# Patient Record
Sex: Female | Born: 1970 | Race: Black or African American | Hispanic: No | Marital: Single | Smoking: Former smoker
Health system: Southern US, Community
[De-identification: ages and names within clinical notes are randomized; demographics above are authoritative.]

## PROBLEM LIST (undated history)

## (undated) DIAGNOSIS — R011 Cardiac murmur, unspecified: Secondary | ICD-10-CM

## (undated) HISTORY — DX: Cardiac murmur, unspecified: R01.1

---

## 1998-12-02 ENCOUNTER — Emergency Department (HOSPITAL_COMMUNITY): Admission: EM | Admit: 1998-12-02 | Discharge: 1998-12-02 | Payer: Self-pay | Admitting: Emergency Medicine

## 1999-08-16 ENCOUNTER — Emergency Department (HOSPITAL_COMMUNITY): Admission: EM | Admit: 1999-08-16 | Discharge: 1999-08-16 | Payer: Self-pay

## 1999-08-28 ENCOUNTER — Emergency Department (HOSPITAL_COMMUNITY): Admission: EM | Admit: 1999-08-28 | Discharge: 1999-08-28 | Payer: Self-pay

## 1999-08-31 ENCOUNTER — Emergency Department (HOSPITAL_COMMUNITY): Admission: EM | Admit: 1999-08-31 | Discharge: 1999-08-31 | Payer: Self-pay | Admitting: Emergency Medicine

## 2000-06-07 ENCOUNTER — Emergency Department (HOSPITAL_COMMUNITY): Admission: EM | Admit: 2000-06-07 | Discharge: 2000-06-07 | Payer: Self-pay | Admitting: *Deleted

## 2000-06-11 ENCOUNTER — Inpatient Hospital Stay (HOSPITAL_COMMUNITY): Admission: AD | Admit: 2000-06-11 | Discharge: 2000-06-11 | Payer: Self-pay | Admitting: Obstetrics and Gynecology

## 2000-06-23 ENCOUNTER — Other Ambulatory Visit: Admission: RE | Admit: 2000-06-23 | Discharge: 2000-06-23 | Payer: Self-pay | Admitting: Obstetrics and Gynecology

## 2000-12-06 ENCOUNTER — Inpatient Hospital Stay (HOSPITAL_COMMUNITY): Admission: AD | Admit: 2000-12-06 | Discharge: 2000-12-06 | Payer: Self-pay | Admitting: Obstetrics and Gynecology

## 2001-01-26 ENCOUNTER — Inpatient Hospital Stay (HOSPITAL_COMMUNITY): Admission: AD | Admit: 2001-01-26 | Discharge: 2001-01-26 | Payer: Self-pay | Admitting: Obstetrics and Gynecology

## 2001-02-01 ENCOUNTER — Inpatient Hospital Stay (HOSPITAL_COMMUNITY): Admission: AD | Admit: 2001-02-01 | Discharge: 2001-02-01 | Payer: Self-pay | Admitting: Obstetrics and Gynecology

## 2001-02-01 ENCOUNTER — Inpatient Hospital Stay (HOSPITAL_COMMUNITY): Admission: AD | Admit: 2001-02-01 | Discharge: 2001-02-03 | Payer: Self-pay | Admitting: Obstetrics and Gynecology

## 2002-02-19 ENCOUNTER — Encounter: Payer: Self-pay | Admitting: Emergency Medicine

## 2002-02-19 ENCOUNTER — Emergency Department (HOSPITAL_COMMUNITY): Admission: EM | Admit: 2002-02-19 | Discharge: 2002-02-19 | Payer: Self-pay | Admitting: Emergency Medicine

## 2005-10-17 ENCOUNTER — Emergency Department (HOSPITAL_COMMUNITY): Admission: EM | Admit: 2005-10-17 | Discharge: 2005-10-17 | Payer: Self-pay | Admitting: Emergency Medicine

## 2009-03-23 ENCOUNTER — Emergency Department (HOSPITAL_COMMUNITY): Admission: EM | Admit: 2009-03-23 | Discharge: 2009-03-23 | Payer: Self-pay | Admitting: Emergency Medicine

## 2009-11-21 ENCOUNTER — Emergency Department (HOSPITAL_COMMUNITY): Admission: EM | Admit: 2009-11-21 | Discharge: 2009-11-21 | Payer: Self-pay | Admitting: Emergency Medicine

## 2010-08-17 NOTE — H&P (Signed)
[facility]  Patient:    KEYMANI, GLYNN Visit Number: [id number] MRN: [id number]          Service Type: OBS Location: [id number] Attending Physician:  Leonard Schwartz Dictated by:   Wynelle Bourgeois, CNM Admit Date:  02/01/2001                           History and Physical  HISTORY OF PRESENT ILLNESS:   This is a 40 year old, G2, P1-0-0-1, at 40-2/7 weeks, who presents with regular uterine contractions every five to seven minutes for several hours.  She denies leaking.  She reports positive fetal movement.  Her pregnancy has been followed by the nurse midwife service and remarkable for:  1. First trimester bacterial vaginosis.  2. Group B streptococcus negative.  She is requesting an epidural for pain management.  OBSTETRICAL HISTORY:          Remarkable for a spontaneous vaginal delivery in 1996 of a female infant at [redacted] weeks gestation, weighing 7 pounds 14 ounces, following a three-day induction with no complications.  PAST MEDICAL HISTORY:         Remarkable for history of hyperemesis with her first pregnancy and history of being told she has a heart murmur, but has never been treated.  PAST SURGICAL HISTORY:        Remarkable for wisdom teeth extraction three years ago.  FAMILY HISTORY:               Remarkable for maternal uncle with renal failure.  GENETIC HISTORY:              Unremarkable.  SOCIAL HISTORY:               The patient is single.  The father of the baby, Piedad Climes, is not involved.  She does have two female family members with her at present.  She does not report a religious affiliation.  She denies alcohol, tobacco, or drug use.  PHYSICAL EXAMINATION:  VITAL SIGNS:                  Stable.  She is afebrile.  HEENT:                        Within normal limits.  Thyroid normal and not enlarged.  CHEST:                        Clear to auscultation bilaterally.  HEART:                        Regular  rate and rhythm.  ABDOMEN:                      Gravid at 40 cm.  Vertex to [address].  EFM denotes reactive fetal heart rate, 130-140 with accelerations to 150-155. There are variable decelerations to 90, which have quick return to baseline and are intermittent.  Uterine contractions every three to nine minutes.  PELVIC:                       Cervix 5 cm, 100% effaced, and 0 station with a vertex presentation and bulging bag of waters.  EXTREMITIES:                  Within normal limits.  ASSESSMENT:                   1. Intrauterine pregnancy at 40-2/7 weeks.                               2. Active labor with irregular uterine                                  contractions.                               3. Group B streptococcus negative.  PLAN:                         1. Admit to birthing suite.  Janine Limbo,                                  M.D., notified of admission.                               2. Routine CNM orders.                               3. Plan epidural followed by amniotomy to                                  stimulate labor. Dictated by:   Wynelle Bourgeois, CNM Attending Physician:  Leonard Schwartz DD:  02/01/01 TD:  02/01/01 Job: [id number]

## 2012-09-28 ENCOUNTER — Emergency Department (HOSPITAL_COMMUNITY): Payer: Self-pay

## 2012-09-28 ENCOUNTER — Encounter (HOSPITAL_COMMUNITY): Payer: Self-pay | Admitting: Emergency Medicine

## 2012-09-28 ENCOUNTER — Emergency Department (HOSPITAL_COMMUNITY)
Admission: EM | Admit: 2012-09-28 | Discharge: 2012-09-28 | Disposition: A | Discharge status: Home / Self Care | Payer: Self-pay | Attending: Emergency Medicine | Admitting: Emergency Medicine

## 2012-09-28 DIAGNOSIS — R05 Cough: Secondary | ICD-10-CM | POA: Insufficient documentation

## 2012-09-28 DIAGNOSIS — R5383 Other fatigue: Secondary | ICD-10-CM | POA: Insufficient documentation

## 2012-09-28 DIAGNOSIS — J020 Streptococcal pharyngitis: Secondary | ICD-10-CM | POA: Insufficient documentation

## 2012-09-28 DIAGNOSIS — R5381 Other malaise: Secondary | ICD-10-CM | POA: Insufficient documentation

## 2012-09-28 DIAGNOSIS — R059 Cough, unspecified: Secondary | ICD-10-CM | POA: Insufficient documentation

## 2012-09-28 DIAGNOSIS — E876 Hypokalemia: Secondary | ICD-10-CM | POA: Insufficient documentation

## 2012-09-28 DIAGNOSIS — R509 Fever, unspecified: Secondary | ICD-10-CM | POA: Insufficient documentation

## 2012-09-28 DIAGNOSIS — IMO0001 Reserved for inherently not codable concepts without codable children: Secondary | ICD-10-CM | POA: Insufficient documentation

## 2012-09-28 DIAGNOSIS — F17200 Nicotine dependence, unspecified, uncomplicated: Secondary | ICD-10-CM | POA: Insufficient documentation

## 2012-09-28 DIAGNOSIS — R079 Chest pain, unspecified: Secondary | ICD-10-CM | POA: Insufficient documentation

## 2012-09-28 DIAGNOSIS — R51 Headache: Secondary | ICD-10-CM | POA: Insufficient documentation

## 2012-09-28 LAB — CBC
HCT: 37.8 % (ref 36.0–46.0)
Hemoglobin: 12.4 g/dL (ref 12.0–15.0)
MCH: 24.4 pg — ABNORMAL LOW (ref 26.0–34.0)
MCHC: 32.8 g/dL (ref 30.0–36.0)
MCV: 74.4 fL — ABNORMAL LOW (ref 78.0–100.0)
Platelets: 220 10*3/uL (ref 150–400)
RBC: 5.08 MIL/uL (ref 3.87–5.11)
RDW: 16.2 % — ABNORMAL HIGH (ref 11.5–15.5)
WBC: 10 10*3/uL (ref 4.0–10.5)

## 2012-09-28 LAB — BASIC METABOLIC PANEL
BUN: 6 mg/dL (ref 6–23)
CO2: 23 mEq/L (ref 19–32)
Calcium: 9.4 mg/dL (ref 8.4–10.5)
Chloride: 98 mEq/L (ref 96–112)
Creatinine, Ser: 0.64 mg/dL (ref 0.50–1.10)
GFR calc Af Amer: >90 mL/min (ref >90)
GFR calc non Af Amer: >90 mL/min (ref >90)
Glucose, Bld: 102 mg/dL — ABNORMAL HIGH (ref 70–99)
Potassium: 3.1 mEq/L — ABNORMAL LOW (ref 3.5–5.1)
Sodium: 135 mEq/L (ref 135–145)

## 2012-09-28 LAB — RAPID STREP SCREEN (MED CTR MEBANE ONLY): Streptococcus, Group A Screen (Direct): POSITIVE — AB

## 2012-09-28 MED ORDER — PENICILLIN V POTASSIUM 500 MG PO TABS: 500.0000 mg | ORAL_TABLET | Freq: Four times a day (QID) | ORAL | Status: DC

## 2012-09-28 MED ORDER — ACETAMINOPHEN 500 MG PO TABS
1000.0000 mg | ORAL_TABLET | Freq: Once | ORAL | Status: AC
  Administered 2012-09-28: 1000 mg via ORAL
  Filled 2012-09-28: qty 2

## 2012-09-28 NOTE — ED Provider Notes (Signed)
History    CSN: [id number] Arrival date & time 09/28/12  1003  First MD Initiated Contact with Patient 09/28/12 1019     Chief Complaint  Patient presents with  . Generalized Body Aches  . Sore Throat  . Chest Pain   (Consider location/radiation/quality/duration/timing/severity/associated sxs/prior Treatment) HPI Pt is a 42yo female with 2 day hx of generalized body aches associated with severe sore throat, worse with swallowing, 8/10, no trouble breathing.  Pt also c/o gradual onset of headache, feels like a typical headache but worse with coughing, 7/10 associated with aching chest pain worse with coughing. Subjective intermittent fever. Has tried aspirin w/o relief.  Denies change in vision, SOB, abdominal pain, n/v/d. Denies urinary symptoms.  Denies recent travel or sick contacts.  LMP: earlier this month, nl per pt. Denies known cardiopulmonary disease.   History reviewed. No pertinent past medical history. History reviewed. No pertinent past surgical history. No family history on file. History  Substance Use Topics  . Smoking status: Current Some Day Smoker    Types: Cigarettes  . Smokeless tobacco: Not on file  . Alcohol Use: Yes     Comment: Ocassional   OB History   Grav Para Term Preterm Abortions TAB SAB Ect Mult Living                 Review of Systems  Constitutional: Positive for fever, chills, appetite change ( decreased) and fatigue. Negative for diaphoresis and unexpected weight change.  HENT: Positive for sore throat. Negative for congestion, rhinorrhea, trouble swallowing, neck pain, neck stiffness and voice change.   Respiratory: Positive for cough. Negative for chest tightness and shortness of breath.   Cardiovascular: Positive for chest pain. Negative for palpitations and leg swelling.  Gastrointestinal: Negative for nausea, vomiting, abdominal pain, diarrhea and constipation.  Genitourinary: Negative for dysuria, urgency, frequency, flank pain, vaginal  bleeding, vaginal discharge, vaginal pain, menstrual problem and pelvic pain.  Musculoskeletal: Positive for myalgias.  Neurological: Positive for headaches.  All other systems reviewed and are negative.    Allergies  Review of patient's allergies indicates no known allergies.  Home Medications   Current Outpatient Rx  Name  Route  Sig  Dispense  Refill  . aspirin 325 MG tablet   Oral   Take 650 mg by mouth every 4 (four) hours as needed for pain.         Marland Kitchen penicillin v potassium (VEETID) 500 MG tablet   Oral   Take 1 tablet (500 mg total) by mouth 4 (four) times daily.   40 tablet   0    BP 130/76  Pulse 82  Temp(Src) 99.3 F (37.4 C) (Oral)  Resp 16  SpO2 100%  LMP 09/07/2012 Physical Exam  Nursing note and vitals reviewed. Constitutional: She appears well-developed and well-nourished.  Pt lying in exam bed, NAD. Appears uncomfortable.   HENT:  Head: Normocephalic and atraumatic.  Right Ear: Hearing, tympanic membrane, external ear and ear canal normal.  Left Ear: Hearing, tympanic membrane, external ear and ear canal normal.  Nose: Nose normal.  Mouth/Throat: Uvula is midline and mucous membranes are normal. No dental abscesses. Oropharyngeal exudate, posterior oropharyngeal edema and posterior oropharyngeal erythema present. No tonsillar abscesses.  Eyes: Conjunctivae are normal. No scleral icterus.  Neck: Normal range of motion. Neck supple.  No nuchal rigidity or meningeal signs.    Cardiovascular: Normal rate, regular rhythm and normal heart sounds.   Pulmonary/Chest: Effort normal and breath sounds normal. No respiratory  distress. She has no wheezes. She has no rales. She exhibits no tenderness.  Abdominal: Soft. Bowel sounds are normal. She exhibits no distension and no mass. There is no tenderness. There is no rebound and no guarding.  Musculoskeletal: Normal range of motion.  Neurological: She is alert.  Skin: Skin is warm and dry. She is not  diaphoretic.    ED Course  Procedures (including critical care time) Labs Reviewed  RAPID STREP SCREEN - Abnormal; Notable for the following:    Streptococcus, Group A Screen (Direct) POSITIVE (*)    All other components within normal limits  CBC - Abnormal; Notable for the following:    MCV 74.4 (*)    MCH 24.4 (*)    RDW 16.2 (*)    All other components within normal limits  BASIC METABOLIC PANEL - Abnormal; Notable for the following:    Potassium 3.1 (*)    Glucose, Bld 102 (*)    All other components within normal limits   Dg Chest 2 View  09/28/2012   *RADIOLOGY REPORT*  Clinical Data: 42 year old female with chest pain times 3 days on the left side.  Shortness of breath.  CHEST - 2 VIEW  Comparison: None.  Findings: Normal lung volumes.  Mild eventration of the right hemidiaphragm. Normal cardiac size and mediastinal contours. Visualized tracheal air column is within normal limits.  No pneumothorax or pleural effusion.  The lungs are clear. No acute osseous abnormality identified.  IMPRESSION: Negative, no acute cardiopulmonary abnormality.   Original Report Authenticated By: Erskine Speed, M.D.   1. Strep pharyngitis   2. Hypokalemia     MDM  Pt presented with 2 day hx of flu-like symptoms including severe sore throat, headache, chest pain, and body aches.  Will get basic labs, rapid strep, and CXR.  Strep: Pos. CXR: nl CBC: unremarkable  BMP: mild hypokalemia   Rx: PCN. May take OTC tylenol and ibuprofen as needed for pain and fever. Work note provided. Will discharge pt home and have her f/u with [facility] and [facility] info provided. Return precautions given. Pt verbalized understanding and agreement with tx plan. Vitals: unremarkable. Discharged in stable condition.    Discussed pt with attending during ED encounter.   Junius Finner, PA-C 09/28/12 1359

## 2012-09-28 NOTE — ED Notes (Signed)
Pt arrived by POV with c/o body aches along with CP and throat pain. Stated that it started Saturday. Denies any n/v, SOB and dizziness. Stated that she did have a little diarrhea last night and this morning.

## 2012-09-28 NOTE — ED Notes (Signed)
Patient transported to X-ray 

## 2012-09-29 NOTE — ED Provider Notes (Signed)
Medical screening examination/treatment/procedure(s) were performed by non-physician practitioner and as supervising physician I was immediately available for consultation/collaboration.  Mira Balon, MD 09/29/12 1535 

## 2013-07-09 ENCOUNTER — Emergency Department (HOSPITAL_COMMUNITY): Payer: Self-pay

## 2013-07-09 ENCOUNTER — Encounter (HOSPITAL_COMMUNITY): Payer: Self-pay | Admitting: Emergency Medicine

## 2013-07-09 ENCOUNTER — Emergency Department (HOSPITAL_COMMUNITY)
Admission: EM | Admit: 2013-07-09 | Discharge: 2013-07-09 | Disposition: A | Discharge status: Home / Self Care | Payer: Self-pay | Attending: Emergency Medicine | Admitting: Emergency Medicine

## 2013-07-09 DIAGNOSIS — S27819A Unspecified injury of esophagus (thoracic part), initial encounter: Secondary | ICD-10-CM | POA: Insufficient documentation

## 2013-07-09 DIAGNOSIS — Y929 Unspecified place or not applicable: Secondary | ICD-10-CM | POA: Insufficient documentation

## 2013-07-09 DIAGNOSIS — Y9389 Activity, other specified: Secondary | ICD-10-CM | POA: Insufficient documentation

## 2013-07-09 DIAGNOSIS — S27818A Other injury of esophagus (thoracic part), initial encounter: Secondary | ICD-10-CM

## 2013-07-09 DIAGNOSIS — IMO0002 Reserved for concepts with insufficient information to code with codable children: Secondary | ICD-10-CM | POA: Insufficient documentation

## 2013-07-09 DIAGNOSIS — Z792 Long term (current) use of antibiotics: Secondary | ICD-10-CM | POA: Insufficient documentation

## 2013-07-09 DIAGNOSIS — Z7982 Long term (current) use of aspirin: Secondary | ICD-10-CM | POA: Insufficient documentation

## 2013-07-09 DIAGNOSIS — F17200 Nicotine dependence, unspecified, uncomplicated: Secondary | ICD-10-CM | POA: Insufficient documentation

## 2013-07-09 MED ORDER — GI COCKTAIL ~~LOC~~
30.0000 mL | Freq: Once | ORAL | Status: AC
  Administered 2013-07-09: 30 mL via ORAL
  Filled 2013-07-09: qty 30

## 2013-07-09 MED ORDER — TRAMADOL HCL 50 MG PO TABS: 50.0000 mg | ORAL_TABLET | Freq: Four times a day (QID) | ORAL | Status: DC | PRN

## 2013-07-09 NOTE — Discharge Instructions (Signed)
Follow up with the ENT physician if symptoms persist.  Tramadol tablets What is this medicine? TRAMADOL (TRA ma dole) is a pain reliever. It is used to treat moderate to severe pain in adults. This medicine may be used for other purposes; ask your health care provider or pharmacist if you have questions. COMMON BRAND NAME(S): Ultram What should I tell my health care provider before I take this medicine? They need to know if you have any of these conditions: -brain tumor -depression -drug abuse or addiction -head injury -if you frequently drink alcohol containing drinks -kidney disease or trouble passing urine -liver disease -lung disease, asthma, or breathing problems -seizures or epilepsy -suicidal thoughts, plans, or attempt; a previous suicide attempt by you or a family member -an unusual or allergic reaction to tramadol, codeine, other medicines, foods, dyes, or preservatives -pregnant or trying to get pregnant -breast-feeding How should I use this medicine? Take this medicine by mouth with a full glass of water. Follow the directions on the prescription label. If the medicine upsets your stomach, take it with food or milk. Do not take more medicine than you are told to take. Talk to your pediatrician regarding the use of this medicine in children. Special care may be needed. Overdosage: If you think you have taken too much of this medicine contact a poison control center or emergency room at once. NOTE: This medicine is only for you. Do not share this medicine with others. What if I miss a dose? If you miss a dose, take it as soon as you can. If it is almost time for your next dose, take only that dose. Do not take double or extra doses. What may interact with this medicine? Do not take this medicine with any of the following medications: -MAOIs like Carbex, Eldepryl, Marplan, Nardil, and Parnate This medicine may also interact with the following medications: -alcohol or  medicines that contain alcohol -antihistamines -benzodiazepines -bupropion -carbamazepine or oxcarbazepine -clozapine -cyclobenzaprine -digoxin -furazolidone -linezolid -medicines for depression, anxiety, or psychotic disturbances -medicines for migraine headache like almotriptan, eletriptan, frovatriptan, naratriptan, rizatriptan, sumatriptan, zolmitriptan -medicines for pain like pentazocine, buprenorphine, butorphanol, meperidine, nalbuphine, and propoxyphene -medicines for sleep -muscle relaxants -naltrexone -phenobarbital -phenothiazines like perphenazine, thioridazine, chlorpromazine, mesoridazine, fluphenazine, prochlorperazine, promazine, and trifluoperazine -procarbazine -warfarin This list may not describe all possible interactions. Give your health care provider a list of all the medicines, herbs, non-prescription drugs, or dietary supplements you use. Also tell them if you smoke, drink alcohol, or use illegal drugs. Some items may interact with your medicine. What should I watch for while using this medicine? Tell your doctor or health care professional if your pain does not go away, if it gets worse, or if you have new or a different type of pain. You may develop tolerance to the medicine. Tolerance means that you will need a higher dose of the medicine for pain relief. Tolerance is normal and is expected if you take this medicine for a long time. Do not suddenly stop taking your medicine because you may develop a severe reaction. Your body becomes used to the medicine. This does NOT mean you are addicted. Addiction is a behavior related to getting and using a drug for a non-medical reason. If you have pain, you have a medical reason to take pain medicine. Your doctor will tell you how much medicine to take. If your doctor wants you to stop the medicine, the dose will be slowly lowered over time to avoid any side effects.  You may get drowsy or dizzy. Do not drive, use machinery,  or do anything that needs mental alertness until you know how this medicine affects you. Do not stand or sit up quickly, especially if you are an older patient. This reduces the risk of dizzy or fainting spells. Alcohol can increase or decrease the effects of this medicine. Avoid alcoholic drinks. You may have constipation. Try to have a bowel movement at least every 2 to 3 days. If you do not have a bowel movement for 3 days, call your doctor or health care professional. Your mouth may get dry. Chewing sugarless gum or sucking hard candy, and drinking plenty of water may help. Contact your doctor if the problem does not go away or is severe. What side effects may I notice from receiving this medicine? Side effects that you should report to your doctor or health care professional as soon as possible: -allergic reactions like skin rash, itching or hives, swelling of the face, lips, or tongue -breathing difficulties, wheezing -confusion -itching -light headedness or fainting spells -redness, blistering, peeling or loosening of the skin, including inside the mouth -seizures Side effects that usually do not require medical attention (report to your doctor or health care professional if they continue or are bothersome): -constipation -dizziness -drowsiness -headache -nausea, vomiting This list may not describe all possible side effects. Call your doctor for medical advice about side effects. You may report side effects to FDA at 1-800-FDA-1088. Where should I keep my medicine? Keep out of the reach of children. Store at room temperature between 15 and 30 degrees C (59 and 86 degrees F). Keep container tightly closed. Throw away any unused medicine after the expiration date. NOTE: This sheet is a summary. It may not cover all possible information. If you have questions about this medicine, talk to your doctor, pharmacist, or health care provider.  2014, [employer]vier/Gold Standard. (2009-11-29  11:55:44)

## 2013-07-09 NOTE — ED Notes (Signed)
Patient states that she felt like she could feel the shell in the back of her throat. RN assessed patient airway and could not visualize shell.

## 2013-07-09 NOTE — ED Notes (Signed)
Pt states she had shrimp for dinner and thinks the shell from one is stuck in her throat.  Pt states she has started coughing blood from coughing so much

## 2013-07-09 NOTE — ED Provider Notes (Signed)
CSN: [id number]     Arrival date & time 07/09/13  4098 History   First MD Initiated Contact with Patient 07/09/13 [phone]     Chief Complaint  Patient presents with  . Hemoptysis     (Consider location/radiation/quality/duration/timing/severity/associated sxs/prior Treatment) The history is provided by the patient.   43 year old female was eating shrimp for dinner and one of them apparently had not been adequately peeled and she feels like the cell is stuck in her throat. It feels like it is on the left side. She's been trying to cough up but has been unable to do so and she is now coughing up some blood. She denies dyspnea, nausea, vomiting.  History reviewed. No pertinent past medical history. History reviewed. No pertinent past surgical history. No family history on file. History  Substance Use Topics  . Smoking status: Current Some Day Smoker    Types: Cigarettes  . Smokeless tobacco: Not on file  . Alcohol Use: Yes     Comment: Ocassional   OB History   Grav Para Term Preterm Abortions TAB SAB Ect Mult Living                 Review of Systems  All other systems reviewed and are negative.     Allergies  Review of patient's allergies indicates no known allergies.  Home Medications   Current Outpatient Rx  Name  Route  Sig  Dispense  Refill  . aspirin 325 MG tablet   Oral   Take 650 mg by mouth every 4 (four) hours as needed for pain.         Marland Kitchen penicillin v potassium (VEETID) 500 MG tablet   Oral   Take 1 tablet (500 mg total) by mouth 4 (four) times daily.   40 tablet   0    BP 144/94  Pulse 111  Temp(Src) 99.2 F (37.3 C) (Oral)  Resp 18  Wt 140 lb (63.504 kg)  SpO2 99% Physical Exam  Nursing note and vitals reviewed.  43 year old female, resting comfortably and in no acute distress. Vital signs are significant for tachycardia with heart rate 111, and hypertension with blood pressure 144/94. Oxygen saturation is 99%, which is normal. Head is  normocephalic and atraumatic. PERRLA, EOMI. Oropharynx is mildly erythematous. No foreign body is seen. There is no stridor. She has no difficulty with her secretions. Phonation is normal. Neck is nontender and supple without adenopathy or JVD. Back is nontender and there is no CVA tenderness. Lungs are clear without rales, wheezes, or rhonchi. Chest is nontender. Heart has regular rate and rhythm without murmur. Abdomen is soft, flat, nontender without masses or hepatosplenomegaly and peristalsis is normoactive. Extremities have no cyanosis or edema, full range of motion is present. Skin is warm and dry without rash. Neurologic: Mental status is normal, cranial nerves are intact, there are no motor or sensory deficits.  ED Course  Procedures (including critical care time) Imaging Review Ct Soft Tissue Neck Wo Contrast  07/09/2013   CLINICAL DATA:  Hemoptysis.  Question foreign body.  EXAM: CT NECK WITHOUT CONTRAST  TECHNIQUE: Multidetector CT imaging of the neck was performed following the standard protocol without intravenous contrast.  COMPARISON:  None.  FINDINGS: As permitted by noncontrast technique, no evidence of mass, edema, or foreign body along the surfaces of the aerodigestive tract. There is motion at the level of the glottis, limiting assessment at this level. The salivary and thyroid glands are unremarkable. No suspicious nodal  enlargement. No acute osseous finding. There is diffuse degenerative disc narrowing with spondylotic spurring and sub- ligamentous disc at C3-4, C4-5, and C5-6. No evidence of high-grade canal stenosis. Incidental torus palatini. Clear apical lungs.  IMPRESSION: Negative noncontrast neck CT.   Electronically Signed   By: Tiburcio PeaJonathan  Watts M.D.   On: 07/09/2013 05:22   Images viewed by me.  MDM   Final diagnoses:  Esophageal abrasion    Possible upper esophageal foreign body versus mucosal abrasion. She'll be given a trial of a GI cocktail and will be sent  for soft tissue CT scan to look for foreign body.  CT shows no evidence of foreign body. She got significant but not complete relief of pain with a GI cocktail. She is referred to ENT for followup. She continues to tolerate secretions well and continues to have normal phonation.  Dione Boozeavid Pranav Lince, MD 07/09/13 [phone]

## 2015-08-17 IMAGING — CT CT NECK W/O CM
4 of 5 series · 16 of 33 positions shown, 18 images · non-contrast
Comparison: None.

CLINICAL DATA: Hemoptysis.  Question foreign body.

EXAM:
CT NECK WITHOUT CONTRAST
TECHNIQUE: Multidetector CT imaging of the neck was performed following the
standard protocol without intravenous contrast.

[Series 5: soft tissue · axial · 0.49mm/px · z∈[+165,+299]mm · 4 of 113 slices shown]
[im 23/113  soft-tissue]
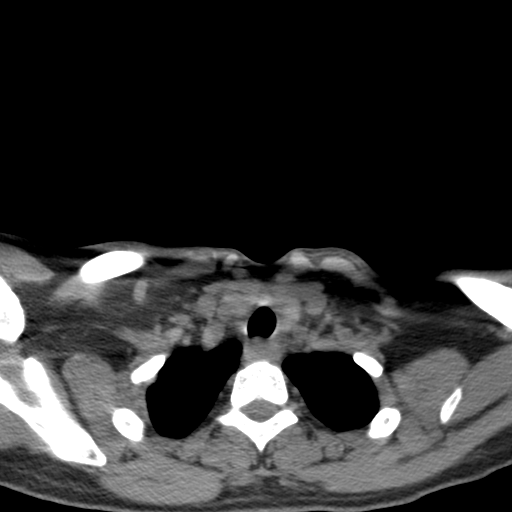
[im 45/113  soft-tissue]
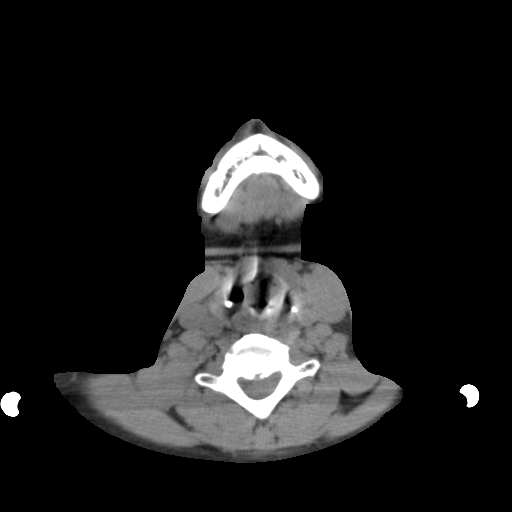
[im 68/113  soft-tissue]
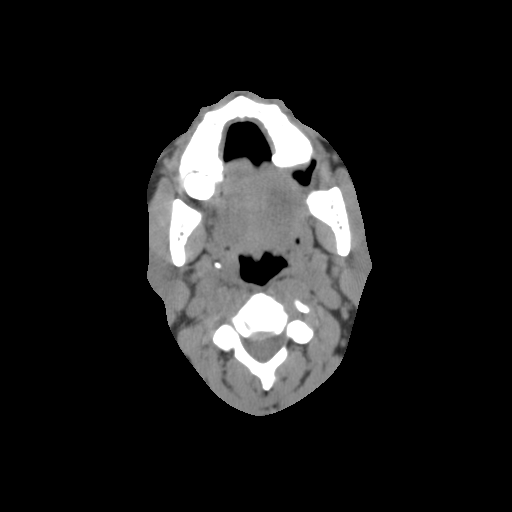
[im 90/113  soft-tissue]
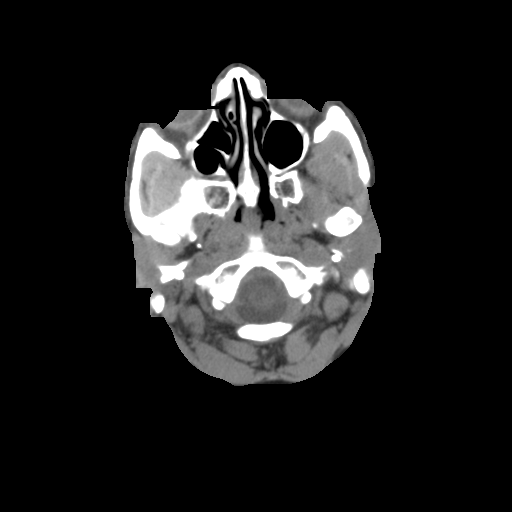

[mpr, sagittal, sagittal · sagittal · 0.49mm/px · 5 of 69 slices shown, 6 images]
[im 23/69  bone]
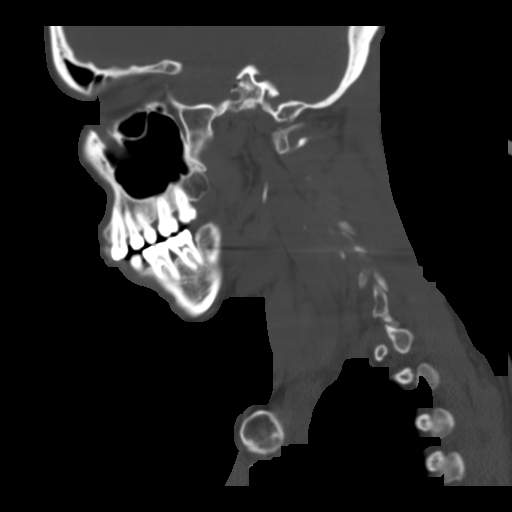
[im 29/69  bone]
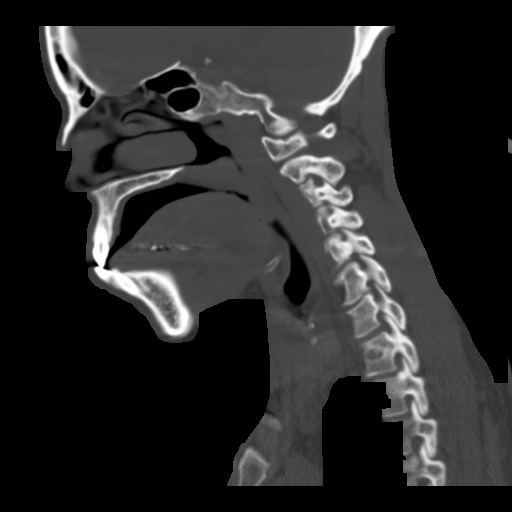
[im 35/69  soft-tissue]
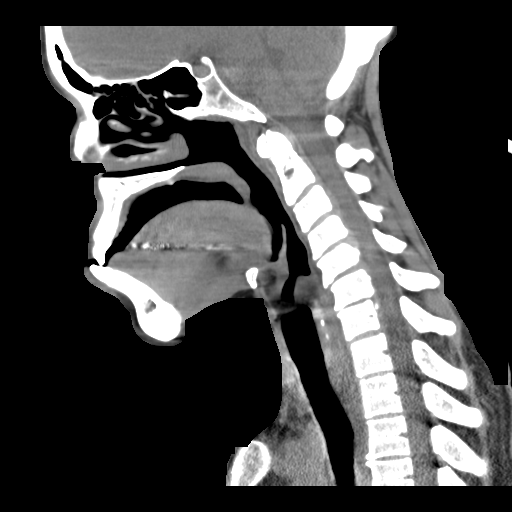
[im 35/69  bone]
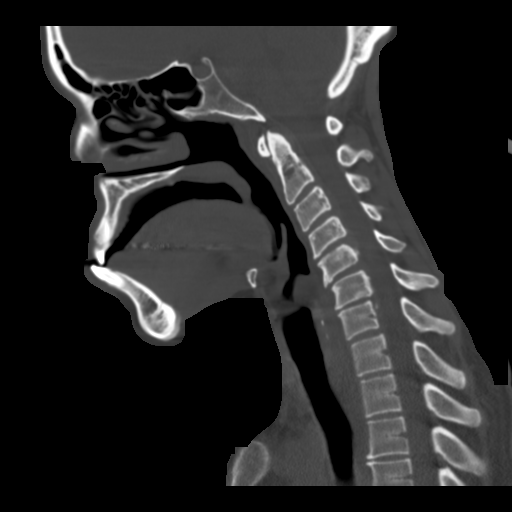
[im 40/69  bone]
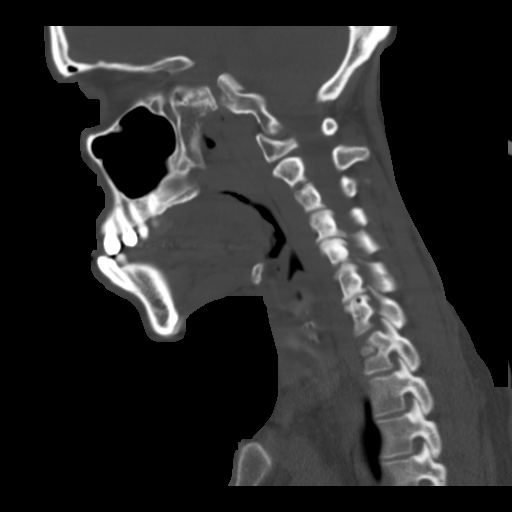
[im 46/69  bone]
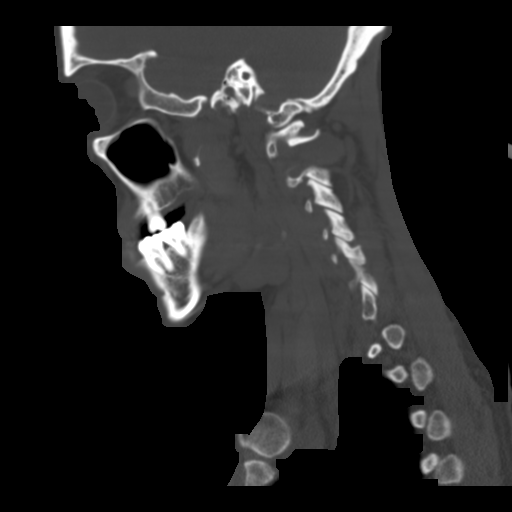

[mpr, coronal, coronal · coronal · 0.49mm/px · 3 of 87 slices shown]
[im 18/87  bone]
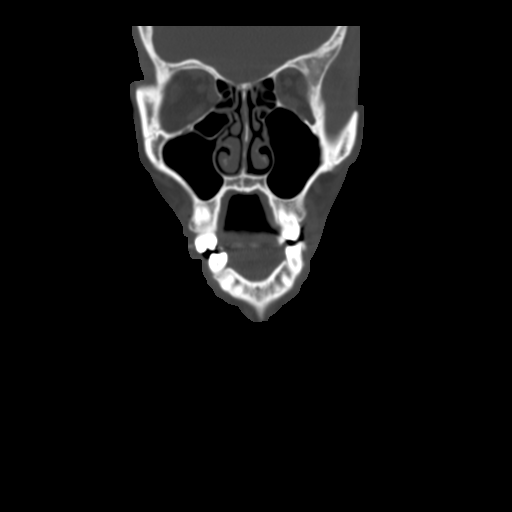
[im 35/87  bone]
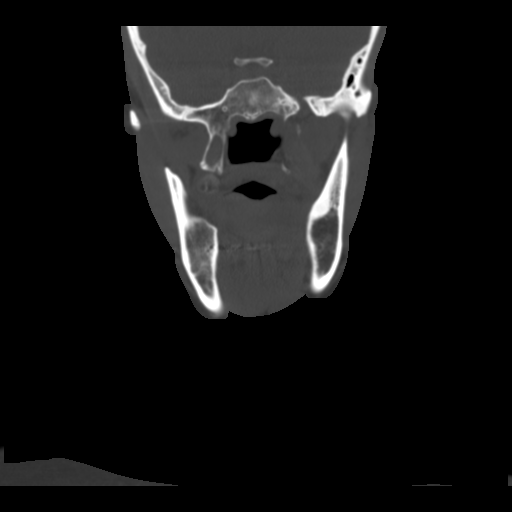
[im 52/87  bone]
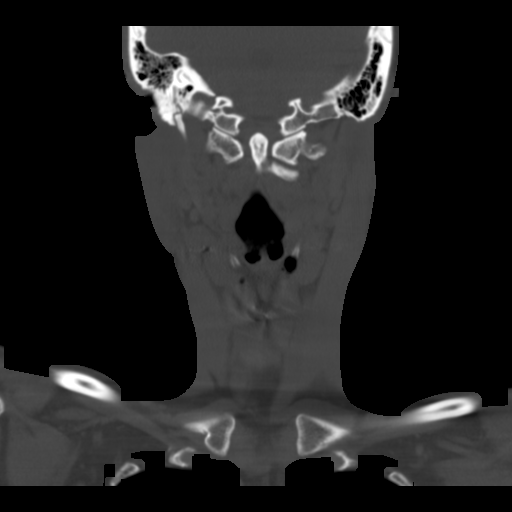

[mpr, orthogonal · axial · 0.49mm/px · z∈[+130,+239]mm · 4 of 103 slices shown, 5 images]
[im 21/103  soft-tissue]
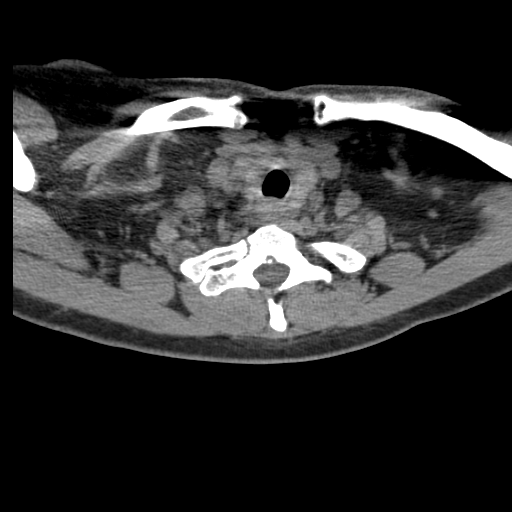
[im 21/103  bone]
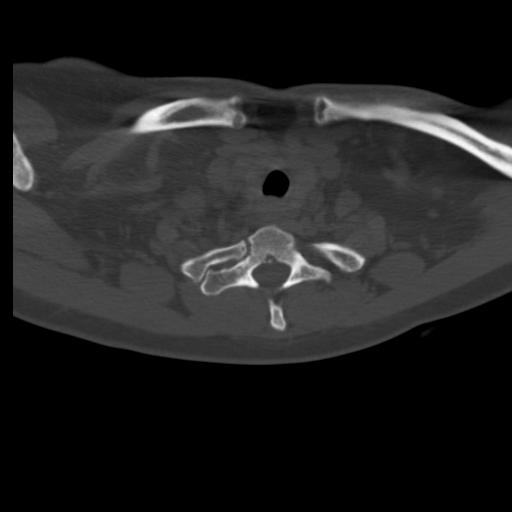
[im 41/103  bone]
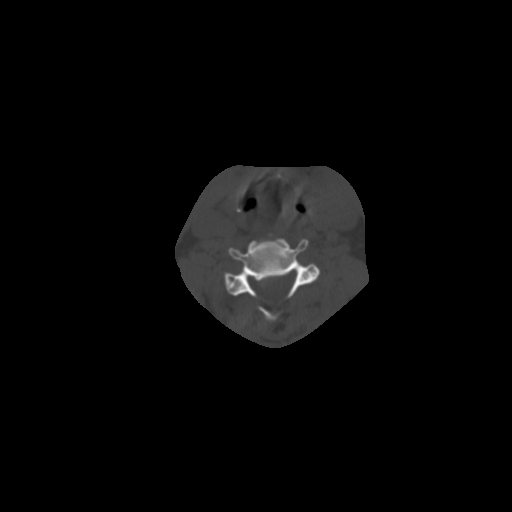
[im 62/103  bone]
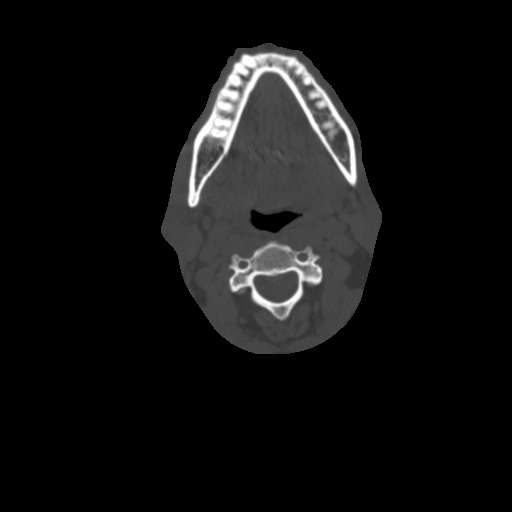
[im 82/103  bone]
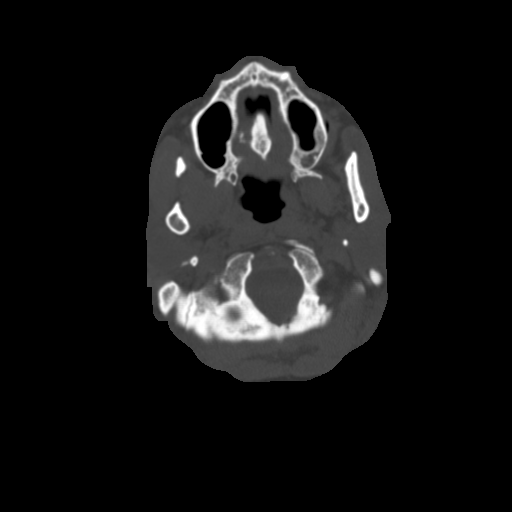

[16 of 33 positions shown; findings below may reference images not displayed]

FINDINGS: As permitted by noncontrast technique, no evidence of mass, edema,
or foreign body along the surfaces of the aerodigestive tract. There
is motion at the level of the glottis, limiting assessment at this
level. The salivary and thyroid glands are unremarkable. No
suspicious nodal enlargement. No acute osseous finding. There is
diffuse degenerative disc narrowing with spondylotic spurring and
sub- ligamentous disc at C3-4, C4-5, and C5-6. No evidence of
high-grade canal stenosis. Incidental torus palatini. Clear apical
lungs.
IMPRESSION: Negative noncontrast neck CT.

## 2016-10-18 ENCOUNTER — Encounter (HOSPITAL_COMMUNITY): Payer: Self-pay | Admitting: Emergency Medicine

## 2016-10-18 ENCOUNTER — Emergency Department (HOSPITAL_COMMUNITY)
Admission: EM | Admit: 2016-10-18 | Discharge: 2016-10-18 | Disposition: A | Discharge status: Home / Self Care | Payer: Self-pay | Attending: Emergency Medicine | Admitting: Emergency Medicine

## 2016-10-18 DIAGNOSIS — M549 Dorsalgia, unspecified: Secondary | ICD-10-CM

## 2016-10-18 DIAGNOSIS — M545 Low back pain: Secondary | ICD-10-CM | POA: Insufficient documentation

## 2016-10-18 DIAGNOSIS — F17210 Nicotine dependence, cigarettes, uncomplicated: Secondary | ICD-10-CM | POA: Insufficient documentation

## 2016-10-18 MED ORDER — NAPROXEN 500 MG PO TABS: 500.0000 mg | ORAL_TABLET | Freq: Two times a day (BID) | ORAL | 0 refills | Status: DC

## 2016-10-18 MED ORDER — HYDROCODONE-ACETAMINOPHEN 5-325 MG PO TABS
2.0000 | ORAL_TABLET | Freq: Once | ORAL | Status: AC
  Administered 2016-10-18: 2 via ORAL
  Filled 2016-10-18: qty 2

## 2016-10-18 MED ORDER — KETOROLAC TROMETHAMINE 60 MG/2ML IM SOLN
60.0000 mg | Freq: Once | INTRAMUSCULAR | Status: AC
  Administered 2016-10-18: 60 mg via INTRAMUSCULAR
  Filled 2016-10-18: qty 2

## 2016-10-18 MED ORDER — HYDROCODONE-ACETAMINOPHEN 5-325 MG PO TABS: 2.0000 | ORAL_TABLET | ORAL | 0 refills | Status: DC | PRN

## 2016-10-18 NOTE — ED Triage Notes (Signed)
Pt reports lower back pain that started approx 2 days ago. Denies GU S/S

## 2016-10-18 NOTE — ED Provider Notes (Signed)
[facility]-EMERGENCY DEPT Provider Note   CSN: [id number] Arrival date & time: 10/18/16  0008  By signing my name below, I, Thelma Bargeick Cochran, attest that this documentation has been prepared under the direction and in the presence of Geoffery Lyonselo, Helder Crisafulli, MD. Electronically Signed: Thelma Bargeick Cochran, Scribe. 10/18/16. 1:51 AM. History   Chief Complaint Chief Complaint  Patient presents with  . Back Pain   The history is provided by the patient. No language interpreter was used.  Back Pain   This is a new problem. The current episode started more than 2 days ago. The problem occurs constantly. The problem has not changed since onset.The pain is associated with no known injury. The quality of the pain is described as shooting. The pain is severe. The symptoms are aggravated by bending, twisting and certain positions. The pain is the same all the time. Pertinent negatives include no bowel incontinence, no bladder incontinence and no dysuria.   HPI Comments: Deborah Wyatt is a 46 y.o. female who presents to the Emergency Department complaining of constant, sharp, shooting pain to her right-sided lower back that began 3 days ago. She states the pain is worse when she moves around or walks. She notes she lifts 15-20 lbs once a week for work but denies any mechanism of injury or trauma. She further denies difficulty urinating, dysuria, hematuria, or changes to bowel/bladder function. She has no PMHx of back problems. History reviewed. No pertinent past medical history.  There are no active problems to display for this patient.   History reviewed. No pertinent surgical history.  OB History    No data available       Home Medications    Prior to Admission medications   Medication Sig Start Date End Date Taking? Authorizing Provider  traMADol (ULTRAM) 50 MG tablet Take 1 tablet (50 mg total) by mouth every 6 (six) hours as needed. 07/09/13   Dione BoozeGlick, David, MD    Family History No family history on  file.  Social History Social History  Substance Use Topics  . Smoking status: Current Some Day Smoker    Types: Cigarettes  . Smokeless tobacco: Not on file  . Alcohol use Yes     Comment: Ocassional     Allergies   Patient has no known allergies.   Review of Systems Review of Systems  Gastrointestinal: Negative for bowel incontinence.  Genitourinary: Negative for bladder incontinence and dysuria.  Musculoskeletal: Positive for back pain.  All other systems reviewed and are negative.    Physical Exam Updated Vital Signs BP 136/77   Pulse 63   Temp 98.2 F (36.8 C) (Oral)   Resp 20   Ht 5\' 4"  (1.626 m)   Wt 140 lb (63.5 kg)   SpO2 100%   BMI 24.03 kg/m   Physical Exam  Constitutional: She is oriented to person, place, and time. She appears well-developed and well-nourished. No distress.  HENT:  Head: Normocephalic and atraumatic.  Eyes: EOM are normal.  Neck: Normal range of motion.  Cardiovascular: Normal rate, regular rhythm and normal heart sounds.   Pulmonary/Chest: Effort normal and breath sounds normal.  Abdominal: Soft. She exhibits no distension. There is no tenderness.  Musculoskeletal: Normal range of motion.  TTP in the soft tissues of the lower lumbar region primarily on the right.  Neurological: She is alert and oriented to person, place, and time.  patellar and achilles DTR's are 2+ and equal. Strength 5/5 in both lower extremities.  Skin: Skin is warm  and dry.  Psychiatric: She has a normal mood and affect. Judgment normal.  Nursing note and vitals reviewed.    ED Treatments / Results  DIAGNOSTIC STUDIES: Oxygen Saturation is 100% on RA, normal by my interpretation.    COORDINATION OF CARE: 1:51 AM Discussed treatment plan with pt at bedside and pt agreed to plan.  Labs (all labs ordered are listed, but only abnormal results are displayed) Labs Reviewed - No data to display  EKG  EKG Interpretation None       Radiology No  results found.  Procedures Procedures (including critical care time)  Medications Ordered in ED Medications - No data to display   Initial Impression / Assessment and Plan / ED Course  I have reviewed the triage vital signs and the nursing notes.  Pertinent labs & imaging results that were available during my care of the patient were reviewed by me and considered in my medical decision making (see chart for details).  Patient with back pain that is clearly musculoskeletal in nature. She was given Toradol and pain medicine and will be discharged with naproxen and hydrocodone. She has a follow-up with primary Dr. if not improving in the next week.  Final Clinical Impressions(s) / ED Diagnoses   Final diagnoses:  None    New Prescriptions New Prescriptions   No medications on file  I personally performed the services described in this documentation, which was scribed in my presence. The recorded information has been reviewed and is accurate.        Geoffery Lyons, MD 10/18/16 [phone]

## 2016-10-18 NOTE — Discharge Instructions (Signed)
Naproxen as prescribed.  Hydrocodone as prescribed as needed for pain not relieved with naproxen.  Follow-up with your primary Dr. if not improving in the next week to discuss imaging studies or physical therapy.

## 2019-12-26 ENCOUNTER — Encounter (HOSPITAL_COMMUNITY): Payer: Self-pay | Admitting: Emergency Medicine

## 2019-12-26 ENCOUNTER — Other Ambulatory Visit: Payer: Self-pay

## 2019-12-26 ENCOUNTER — Emergency Department (HOSPITAL_COMMUNITY)
Admission: EM | Admit: 2019-12-26 | Discharge: 2019-12-26 | Disposition: A | Discharge status: Home / Self Care | Payer: Self-pay | Attending: Emergency Medicine | Admitting: Emergency Medicine

## 2019-12-26 DIAGNOSIS — F17210 Nicotine dependence, cigarettes, uncomplicated: Secondary | ICD-10-CM | POA: Insufficient documentation

## 2019-12-26 DIAGNOSIS — T63461A Toxic effect of venom of wasps, accidental (unintentional), initial encounter: Secondary | ICD-10-CM | POA: Insufficient documentation

## 2019-12-26 MED ORDER — FAMOTIDINE 20 MG PO TABS: 20.0000 mg | ORAL_TABLET | Freq: Two times a day (BID) | ORAL | 0 refills | Status: DC

## 2019-12-26 MED ORDER — PREDNISONE 20 MG PO TABS
60.0000 mg | ORAL_TABLET | Freq: Once | ORAL | Status: AC
  Administered 2019-12-26: 60 mg via ORAL
  Filled 2019-12-26: qty 3

## 2019-12-26 MED ORDER — DIPHENHYDRAMINE HCL 25 MG PO CAPS
25.0000 mg | ORAL_CAPSULE | Freq: Once | ORAL | Status: AC
  Administered 2019-12-26: 25 mg via ORAL
  Filled 2019-12-26: qty 1

## 2019-12-26 MED ORDER — FAMOTIDINE 20 MG PO TABS
20.0000 mg | ORAL_TABLET | Freq: Once | ORAL | Status: AC
  Administered 2019-12-26: 20 mg via ORAL
  Filled 2019-12-26: qty 1

## 2019-12-26 MED ORDER — DIPHENHYDRAMINE HCL 25 MG PO TABS: 25.0000 mg | ORAL_TABLET | Freq: Four times a day (QID) | ORAL | 0 refills | Status: DC | PRN

## 2019-12-26 MED ORDER — PREDNISONE 20 MG PO TABS: 20.0000 mg | ORAL_TABLET | Freq: Every day | ORAL | 0 refills | Status: AC

## 2019-12-26 NOTE — ED Provider Notes (Signed)
[facility] EMERGENCY DEPARTMENT Provider Note   CSN: [id number] Arrival date & time: 12/26/19  1348     History Chief Complaint  Patient presents with  . Insect Bite    Deborah Wyatt is a 49 y.o. female with no known past medical history.  HPI Patient presents to emergency department today with chief complaint of insect bite x2 days ago.  She was stung by a wasp on her left arm.  She is endorsing itching, redness and swelling. She tried putting tobacco sauce on the area which helped with pain initially. She has been icing intermittently with minimal pain relief. She has never had an allergic reaction like this before. Never been stung by an insect. She denies any difficulty breathing or shortness of breath, lightheadedness, syncope, headache, fever, drowsiness, muscle spasm.      History reviewed. No pertinent past medical history.  There are no problems to display for this patient.   History reviewed. No pertinent surgical history.   OB History   No obstetric history on file.     No family history on file.  Social History   Tobacco Use  . Smoking status: Current Some Day Smoker    Types: Cigarettes  . Smokeless tobacco: Never Used  Substance Use Topics  . Alcohol use: Yes    Comment: Ocassional  . Drug use: No    Home Medications Prior to Admission medications   Medication Sig Start Date End Date Taking? Authorizing Provider  diphenhydrAMINE (BENADRYL) 25 MG tablet Take 1 tablet (25 mg total) by mouth every 6 (six) hours as needed for up to 5 days. 12/26/19 12/31/19  Blonnie Maske E, PA-C  famotidine (PEPCID) 20 MG tablet Take 1 tablet (20 mg total) by mouth 2 (two) times daily for 5 days. 12/27/19 01/01/20  Jessi Jessop, Caroleen Hamman, PA-C  HYDROcodone-acetaminophen (NORCO) 5-325 MG tablet Take 2 tablets by mouth every 4 (four) hours as needed. 10/18/16   Geoffery Lyons, MD  naproxen (NAPROSYN) 500 MG tablet Take 1 tablet (500 mg total) by mouth 2  (two) times daily. 10/18/16   Geoffery Lyons, MD  predniSONE (DELTASONE) 20 MG tablet Take 1 tablet (20 mg total) by mouth daily for 5 days. 12/27/19 01/01/20  Kellen Dutch E, PA-C  traMADol (ULTRAM) 50 MG tablet Take 1 tablet (50 mg total) by mouth every 6 (six) hours as needed. 07/09/13   Dione Booze, MD    Allergies    Patient has no known allergies.  Review of Systems   Review of Systems All other systems are reviewed and are negative for acute change except as noted in the HPI.  Physical Exam Updated Vital Signs BP 130/71 (BP Location: Right Arm)   Pulse 60   Temp 98.3 F (36.8 C) (Oral)   Resp 16   SpO2 100%   Physical Exam Vitals and nursing note reviewed.  Constitutional:      Appearance: She is well-developed. She is not ill-appearing or toxic-appearing.     Comments: Airway is patent.  No swelling of oral mucosa, no angioedema.  HENT:     Head: Normocephalic and atraumatic.     Nose: Nose normal.  Eyes:     General: No scleral icterus.       Right eye: No discharge.        Left eye: No discharge.     Conjunctiva/sclera: Conjunctivae normal.  Neck:     Vascular: No JVD.  Cardiovascular:     Rate and Rhythm:  Normal rate and regular rhythm.     Pulses: Normal pulses.     Heart sounds: Normal heart sounds.  Pulmonary:     Effort: Pulmonary effort is normal.     Breath sounds: Normal breath sounds.     Comments: Oxygen saturation is 100% on room air Abdominal:     General: There is no distension.  Musculoskeletal:        General: Normal range of motion.     Cervical back: Normal range of motion.  Skin:    General: Skin is warm and dry.     Comments: Erythema noted to medial aspect of left forearm approximately 7x4 cm with mild edema. No stinger felt. No open wound. No hives.  Neurological:     Mental Status: She is oriented to person, place, and time.     GCS: GCS eye subscore is 4. GCS verbal subscore is 5. GCS motor subscore is 6.     Comments: Fluent  speech, no facial droop.  Strong equal grip in bilateral upper extremities.  Psychiatric:        Behavior: Behavior normal.     ED Results / Procedures / Treatments   Labs (all labs ordered are listed, but only abnormal results are displayed) Labs Reviewed - No data to display  EKG None  Radiology No results found.  Procedures Procedures (including critical care time)  Medications Ordered in ED Medications  diphenhydrAMINE (BENADRYL) capsule 25 mg (has no administration in time range)  predniSONE (DELTASONE) tablet 60 mg (has no administration in time range)  famotidine (PEPCID) tablet 20 mg (has no administration in time range)    ED Course  I have reviewed the triage vital signs and the nursing notes.  Pertinent labs & imaging results that were available during my care of the patient were reviewed by me and considered in my medical decision making (see chart for details).    MDM Rules/Calculators/A&P                          History provided by patient with additional history obtained from chart review.    Deborah Wyatt is a 49 y.o. female who presents to ED for pruritic rash. No known triggers or exposures. No evidence of oral swelling or airway compromise. Lungs are clear bilaterally and vitals stable. No respiratory, GI or neurologic symptoms to suggest anaphylaxis. Large area of erythema on left forearm, circled with skin marker. Given Benadryl, pepcid, and steroids in ED.  On re-evaluation, symptoms much improved and patient feels comfortable with discharge to home. Evaluation does not show pathology that would require ongoing emergent intervention or inpatient treatment. Rx for benadryl, pepcid, prednisone given. Home care instructions and return precautions discussed. PCP follow up encouraged if symptoms persist. All questions answered.  The patient appears reasonably screened and/or stabilized for discharge and I doubt any other medical condition or other [facility]  requiring further screening, evaluation, or treatment in the ED at this time prior to discharge. The patient is safe for discharge with strict return precautions discussed.    Portions of this note were generated with Scientist, clinical (histocompatibility and immunogenetics). Dictation errors may occur despite best attempts at proofreading.   Final Clinical Impression(s) / ED Diagnoses Final diagnoses:  Wasp sting, accidental or unintentional, initial encounter    Rx / DC Orders ED Discharge Orders         Ordered    famotidine (PEPCID) 20 MG tablet  2  times daily        12/26/19 1809    predniSONE (DELTASONE) 20 MG tablet  Daily        12/26/19 1809    diphenhydrAMINE (BENADRYL) 25 MG tablet  Every 6 hours PRN        12/26/19 1809           Kathyrn Lass 12/26/19 1827    Tilden Fossa, MD 12/26/19 1949

## 2019-12-26 NOTE — ED Triage Notes (Addendum)
Pt reports wasp sting to L arm on Friday.  C/o itching, redness, and swelling to arm.

## 2019-12-26 NOTE — ED Notes (Signed)
Patient verbalizes understanding of discharge instructions. Opportunity for questioning and answers were provided. Armband removed by staff, pt discharged from ED.  

## 2019-12-26 NOTE — Discharge Instructions (Signed)
Prescription sent to pharmacy for 3 medications to help with allergic reactions.  -Benadryl.  Do not take Benadryl nightly to be working or driving it can make you drowsy. -Pepcid -Prednisone  Already received your first dose of these here in emergency department.  Start taking them tomorrow.  You should also take ibuprofen to help with swelling.  You can take Tylenol for pain.  Take as directed on the bottle.

## 2020-12-05 ENCOUNTER — Other Ambulatory Visit: Payer: Self-pay

## 2020-12-05 ENCOUNTER — Emergency Department (HOSPITAL_COMMUNITY): Payer: Medicaid Other

## 2020-12-05 ENCOUNTER — Emergency Department (HOSPITAL_COMMUNITY)
Admission: EM | Admit: 2020-12-05 | Discharge: 2020-12-06 | Disposition: A | Discharge status: Home / Self Care | Payer: Medicaid Other | Attending: Emergency Medicine | Admitting: Emergency Medicine

## 2020-12-05 ENCOUNTER — Encounter (HOSPITAL_COMMUNITY): Payer: Self-pay

## 2020-12-05 DIAGNOSIS — Z20822 Contact with and (suspected) exposure to covid-19: Secondary | ICD-10-CM | POA: Insufficient documentation

## 2020-12-05 DIAGNOSIS — Z5321 Procedure and treatment not carried out due to patient leaving prior to being seen by health care provider: Secondary | ICD-10-CM | POA: Insufficient documentation

## 2020-12-05 DIAGNOSIS — R0602 Shortness of breath: Secondary | ICD-10-CM | POA: Insufficient documentation

## 2020-12-05 DIAGNOSIS — R079 Chest pain, unspecified: Secondary | ICD-10-CM | POA: Insufficient documentation

## 2020-12-05 LAB — RESP PANEL BY RT-PCR (FLU A&B, COVID) ARPGX2
Influenza A by PCR: NEGATIVE
Influenza B by PCR: NEGATIVE
SARS Coronavirus 2 by RT PCR: NEGATIVE

## 2020-12-05 LAB — CBC WITH DIFFERENTIAL/PLATELET
Abs Immature Granulocytes: 0.01 10*3/uL (ref 0.00–0.07)
Basophils Absolute: 0 10*3/uL (ref 0.0–0.1)
Basophils Relative: 1 %
Eosinophils Absolute: 0.1 10*3/uL (ref 0.0–0.5)
Eosinophils Relative: 2 %
HCT: 39.4 % (ref 36.0–46.0)
Hemoglobin: 12.2 g/dL (ref 12.0–15.0)
Immature Granulocytes: 0 %
Lymphocytes Relative: 27 %
Lymphs Abs: 1.1 10*3/uL (ref 0.7–4.0)
MCH: 23.7 pg — ABNORMAL LOW (ref 26.0–34.0)
MCHC: 31 g/dL (ref 30.0–36.0)
MCV: 76.5 fL — ABNORMAL LOW (ref 80.0–100.0)
Monocytes Absolute: 0.4 10*3/uL (ref 0.1–1.0)
Monocytes Relative: 11 %
Neutro Abs: 2.4 10*3/uL (ref 1.7–7.7)
Neutrophils Relative %: 59 %
Platelets: 263 10*3/uL (ref 150–400)
RBC: 5.15 MIL/uL — ABNORMAL HIGH (ref 3.87–5.11)
RDW: 17.2 % — ABNORMAL HIGH (ref 11.5–15.5)
WBC: 4.1 10*3/uL (ref 4.0–10.5)
nRBC: 0 % (ref 0.0–0.2)

## 2020-12-05 LAB — COMPREHENSIVE METABOLIC PANEL
ALT: 15 U/L (ref 0–44)
AST: 20 U/L (ref 15–41)
Albumin: 3.8 g/dL (ref 3.5–5.0)
Alkaline Phosphatase: 68 U/L (ref 38–126)
Anion gap: 6 (ref 5–15)
BUN: 8 mg/dL (ref 6–20)
CO2: 26 mmol/L (ref 22–32)
Calcium: 9.7 mg/dL (ref 8.9–10.3)
Chloride: 105 mmol/L (ref 98–111)
Creatinine, Ser: 0.77 mg/dL (ref 0.44–1.00)
GFR, Estimated: >60 mL/min (ref >60)
Glucose, Bld: 100 mg/dL — ABNORMAL HIGH (ref 70–99)
Potassium: 3.4 mmol/L — ABNORMAL LOW (ref 3.5–5.1)
Sodium: 137 mmol/L (ref 135–145)
Total Bilirubin: 0.6 mg/dL (ref 0.3–1.2)
Total Protein: 6.8 g/dL (ref 6.5–8.1)

## 2020-12-05 LAB — TROPONIN I (HIGH SENSITIVITY)
Troponin I (High Sensitivity): 2 ng/L (ref <18)
Troponin I (High Sensitivity): 3 ng/L (ref <18)

## 2020-12-05 LAB — LIPASE, BLOOD: Lipase: 30 U/L (ref 11–51)

## 2020-12-05 NOTE — ED Triage Notes (Signed)
Pt reports chest pain under her Left breast associated with SOB. Denies any other symptoms.

## 2020-12-05 NOTE — ED Provider Notes (Signed)
Emergency Medicine Provider Triage Evaluation Note  Deborah Wyatt , a 50 y.o. female  was evaluated in triage.  Pt complains of chest pain and shortness of breath.  Patient reports that symptoms started 2 days prior.  Chest pain has been constant since then.  Chest pain is located under left breast.  Patient describes pain as sharp.  Patient denies any nausea, vomiting, diaphoresis.  No unilateral leg swelling or tenderness to bilateral lower extremities.  Review of Systems  Positive: Chest pain, shortness of breath Negative: Fever, chills, URI symptoms, cough, nausea, vomiting, diaphoresis, abdominal pain.  Physical Exam  BP 129/80 (BP Location: Left Arm)   Pulse 78   Temp 99.1 F (37.3 C) (Oral)   Resp 20   SpO2 100%  Gen:   Awake, no distress   Resp:  Normal effort, lungs clear to auscultation bilaterally MSK:   Moves extremities without difficulty; no swelling or tenderness to bilateral lower extremities Other:  +2 radial pulse bilaterally.  Abdomen soft, nondistended, mild tenderness to bilateral upper abdomen, no peritoneal signs.  Medical Decision Making  Medically screening exam initiated at 5:26 PM.  Appropriate orders placed.  Deborah Wyatt was informed that the remainder of the evaluation will be completed by another provider, this initial triage assessment does not replace that evaluation, and the importance of remaining in the ED until their evaluation is complete.  The patient appears stable so that the remainder of the work up may be completed by another provider.      Haskel Schroeder, PA-C 12/05/20 1728    Maia Plan, MD 12/05/20 2352

## 2020-12-06 ENCOUNTER — Other Ambulatory Visit: Payer: Self-pay

## 2020-12-06 ENCOUNTER — Emergency Department (HOSPITAL_COMMUNITY)
Admission: EM | Admit: 2020-12-06 | Discharge: 2020-12-06 | Disposition: A | Discharge status: Home / Self Care | Payer: Medicaid Other | Attending: Emergency Medicine | Admitting: Emergency Medicine

## 2020-12-06 DIAGNOSIS — F17210 Nicotine dependence, cigarettes, uncomplicated: Secondary | ICD-10-CM | POA: Insufficient documentation

## 2020-12-06 DIAGNOSIS — R0789 Other chest pain: Secondary | ICD-10-CM | POA: Insufficient documentation

## 2020-12-06 DIAGNOSIS — R0602 Shortness of breath: Secondary | ICD-10-CM | POA: Insufficient documentation

## 2020-12-06 DIAGNOSIS — E876 Hypokalemia: Secondary | ICD-10-CM | POA: Insufficient documentation

## 2020-12-06 LAB — CBC WITH DIFFERENTIAL/PLATELET
Abs Immature Granulocytes: 0.01 10*3/uL (ref 0.00–0.07)
Basophils Absolute: 0 10*3/uL (ref 0.0–0.1)
Basophils Relative: 1 %
Eosinophils Absolute: 0.1 10*3/uL (ref 0.0–0.5)
Eosinophils Relative: 2 %
HCT: 37.5 % (ref 36.0–46.0)
Hemoglobin: 11.5 g/dL — ABNORMAL LOW (ref 12.0–15.0)
Immature Granulocytes: 0 %
Lymphocytes Relative: 37 %
Lymphs Abs: 1.2 10*3/uL (ref 0.7–4.0)
MCH: 23.4 pg — ABNORMAL LOW (ref 26.0–34.0)
MCHC: 30.7 g/dL (ref 30.0–36.0)
MCV: 76.4 fL — ABNORMAL LOW (ref 80.0–100.0)
Monocytes Absolute: 0.4 10*3/uL (ref 0.1–1.0)
Monocytes Relative: 11 %
Neutro Abs: 1.6 10*3/uL — ABNORMAL LOW (ref 1.7–7.7)
Neutrophils Relative %: 49 %
Platelets: 242 10*3/uL (ref 150–400)
RBC: 4.91 MIL/uL (ref 3.87–5.11)
RDW: 16.8 % — ABNORMAL HIGH (ref 11.5–15.5)
WBC: 3.3 10*3/uL — ABNORMAL LOW (ref 4.0–10.5)
nRBC: 0 % (ref 0.0–0.2)

## 2020-12-06 LAB — COMPREHENSIVE METABOLIC PANEL
ALT: 15 U/L (ref 0–44)
AST: 18 U/L (ref 15–41)
Albumin: 3.8 g/dL (ref 3.5–5.0)
Alkaline Phosphatase: 67 U/L (ref 38–126)
Anion gap: 8 (ref 5–15)
BUN: 9 mg/dL (ref 6–20)
CO2: 25 mmol/L (ref 22–32)
Calcium: 9.5 mg/dL (ref 8.9–10.3)
Chloride: 103 mmol/L (ref 98–111)
Creatinine, Ser: 0.7 mg/dL (ref 0.44–1.00)
GFR, Estimated: >60 mL/min (ref >60)
Glucose, Bld: 108 mg/dL — ABNORMAL HIGH (ref 70–99)
Potassium: 3.3 mmol/L — ABNORMAL LOW (ref 3.5–5.1)
Sodium: 136 mmol/L (ref 135–145)
Total Bilirubin: 0.2 mg/dL — ABNORMAL LOW (ref 0.3–1.2)
Total Protein: 7 g/dL (ref 6.5–8.1)

## 2020-12-06 LAB — D-DIMER, QUANTITATIVE: D-Dimer, Quant: 0.36 ug/mL-FEU (ref 0.00–0.50)

## 2020-12-06 LAB — TROPONIN I (HIGH SENSITIVITY): Troponin I (High Sensitivity): <2 ng/L (ref <18)

## 2020-12-06 MED ORDER — KETOROLAC TROMETHAMINE 30 MG/ML IJ SOLN: 30.0000 mg | Freq: Once | INTRAMUSCULAR | Status: DC

## 2020-12-06 MED ORDER — FAMOTIDINE 20 MG PO TABS: 20.0000 mg | ORAL_TABLET | Freq: Two times a day (BID) | ORAL | 0 refills | Status: AC

## 2020-12-06 MED ORDER — POTASSIUM CHLORIDE CRYS ER 20 MEQ PO TBCR: 40.0000 meq | EXTENDED_RELEASE_TABLET | Freq: Once | ORAL | Status: DC

## 2020-12-06 NOTE — ED Notes (Signed)
Pt states she has to get home and cant stay

## 2020-12-06 NOTE — ED Triage Notes (Signed)
Pt here from home with c/o cp , was in the ED yesterday with same complaint but left , work was negative , pt wanting a work note

## 2020-12-06 NOTE — Discharge Instructions (Addendum)
Take the medications as needed to help with your symptoms. Follow-up with your primary care provider or the 1 listed below. Return to the ER if you start to experience worsening chest pain, shortness of breath, leg swelling, vomiting

## 2020-12-06 NOTE — ED Provider Notes (Signed)
[facility] EMERGENCY DEPARTMENT Provider Note   CSN: [id number] Arrival date & time: 12/06/20  5277     History No chief complaint on file.   Deborah Wyatt is a 50 y.o. female presenting to the ED with a chief complaint of left lower chest pain and shortness of breath.  Symptoms have been intermittent for the past 2 days.  She was seen yesterday but left the ER due to long wait times.  She does not know if this is related to abdominal pain.  She denies any nausea, vomiting, cough, leg swelling, diarrhea.  She has not experienced symptoms like this in the past.  Denies any history of DVT or PE, recent immobilization, exogenous hormone use, hemoptysis or pleuritic pain.  Her symptoms seem to be getting worse when she is ambulating or "getting worried about something."  HPI     No past medical history on file.  There are no problems to display for this patient.   No past surgical history on file.   OB History   No obstetric history on file.     No family history on file.  Social History   Tobacco Use   Smoking status: Some Days    Types: Cigarettes   Smokeless tobacco: Never  Substance Use Topics   Alcohol use: Yes    Comment: Ocassional   Drug use: No    Home Medications Prior to Admission medications   Medication Sig Start Date End Date Taking? Authorizing Provider  famotidine (PEPCID) 20 MG tablet Take 1 tablet (20 mg total) by mouth 2 (two) times daily. 12/06/20  Yes Deloros Beretta, PA-C  diphenhydrAMINE (BENADRYL) 25 MG tablet Take 1 tablet (25 mg total) by mouth every 6 (six) hours as needed for up to 5 days. 12/26/19 12/31/19  Walisiewicz, Caroleen Hamman, PA-C  HYDROcodone-acetaminophen (NORCO) 5-325 MG tablet Take 2 tablets by mouth every 4 (four) hours as needed. 10/18/16   Geoffery Lyons, MD  naproxen (NAPROSYN) 500 MG tablet Take 1 tablet (500 mg total) by mouth 2 (two) times daily. 10/18/16   Geoffery Lyons, MD  traMADol (ULTRAM) 50 MG tablet Take 1  tablet (50 mg total) by mouth every 6 (six) hours as needed. 07/09/13   Dione Booze, MD    Allergies    Patient has no known allergies.  Review of Systems   Review of Systems  Constitutional:  Negative for appetite change, chills and fever.  HENT:  Negative for ear pain, rhinorrhea, sneezing and sore throat.   Eyes:  Negative for photophobia and visual disturbance.  Respiratory:  Positive for shortness of breath. Negative for cough, chest tightness and wheezing.   Cardiovascular:  Positive for chest pain. Negative for palpitations.  Gastrointestinal:  Negative for abdominal pain, blood in stool, constipation, diarrhea, nausea and vomiting.  Genitourinary:  Negative for dysuria, hematuria and urgency.  Musculoskeletal:  Negative for myalgias.  Skin:  Negative for rash.  Neurological:  Negative for dizziness, weakness and light-headedness.   Physical Exam Updated Vital Signs BP (!) 103/49 (BP Location: Right Arm)   Pulse 65   Temp 98.5 F (36.9 C)   Resp 18   SpO2 100%   Physical Exam Vitals and nursing note reviewed.  Constitutional:      General: She is not in acute distress.    Appearance: She is well-developed.  HENT:     Head: Normocephalic and atraumatic.     Nose: Nose normal.  Eyes:     General:  No scleral icterus.       Left eye: No discharge.     Conjunctiva/sclera: Conjunctivae normal.  Cardiovascular:     Rate and Rhythm: Normal rate and regular rhythm.     Heart sounds: Normal heart sounds. No murmur heard.   No friction rub. No gallop.  Pulmonary:     Effort: Pulmonary effort is normal. No respiratory distress.     Breath sounds: Normal breath sounds.  Abdominal:     General: Bowel sounds are normal. There is no distension.     Palpations: Abdomen is soft.     Tenderness: There is no abdominal tenderness. There is no guarding.  Musculoskeletal:        General: Normal range of motion.     Cervical back: Normal range of motion and neck supple.     Right  lower leg: No edema.     Left lower leg: No edema.     Comments: No lower extremity edema, erythema or calf tenderness bilaterally.  2+ DP pulses noted bilaterally.  Skin:    General: Skin is warm and dry.     Findings: No rash.  Neurological:     Mental Status: She is alert.     Motor: No abnormal muscle tone.     Coordination: Coordination normal.    ED Results / Procedures / Treatments   Labs (all labs ordered are listed, but only abnormal results are displayed) Labs Reviewed  COMPREHENSIVE METABOLIC PANEL - Abnormal; Notable for the following components:      Result Value   Potassium 3.3 (*)    Glucose, Bld 108 (*)    Total Bilirubin 0.2 (*)    All other components within normal limits  CBC WITH DIFFERENTIAL/PLATELET - Abnormal; Notable for the following components:   WBC 3.3 (*)    Hemoglobin 11.5 (*)    MCV 76.4 (*)    MCH 23.4 (*)    RDW 16.8 (*)    Neutro Abs 1.6 (*)    All other components within normal limits  D-DIMER, QUANTITATIVE  TROPONIN I (HIGH SENSITIVITY)    EKG EKG Interpretation  Date/Time:  Wednesday December 06 2020 07:37:45 EDT Ventricular Rate:  65 PR Interval:  154 QRS Duration: 86 QT Interval:  404 QTC Calculation: 420 R Axis:   78 Text Interpretation: Normal sinus rhythm Normal ECG Confirmed by Lockie Mola, Adam ([id number]) on 12/06/2020 11:16:10 AM  Radiology DG Chest 2 View  Result Date: 12/05/2020 CLINICAL DATA:  Chest pain and shortness of breath. EXAM: CHEST - 2 VIEW COMPARISON:  09/28/2012 FINDINGS: The cardiomediastinal contours are normal. The lungs are clear. Pulmonary vasculature is normal. No consolidation, pleural effusion, or pneumothorax. No acute osseous abnormalities are seen. IMPRESSION: Negative radiographs of the chest. Electronically Signed   By: Narda Rutherford M.D.   On: 12/05/2020 18:47    Procedures Procedures   Medications Ordered in ED Medications  ketorolac (TORADOL) 30 MG/ML injection 30 mg (has no administration in  time range)  potassium chloride SA (KLOR-CON) CR tablet 40 mEq (has no administration in time range)    ED Course  I have reviewed the triage vital signs and the nursing notes.  Pertinent labs & imaging results that were available during my care of the patient were reviewed by me and considered in my medical decision making (see chart for details).  Clinical Course as of 12/06/20 1336  Wed Dec 06, 2020  1307 D-Dimer, Quant: 0.36 [HK]  1322 Troponin I (High Sensitivity): <2 [  HK]  1328 Creatinine: 0.70 [HK]    Clinical Course User Index [HK] Dietrich Pates, PA-C   MDM Rules/Calculators/A&P                           50 year old female presenting to the ED for chest pain and shortness of breath.  Symptoms have been going on for the past 2 days.  Presented to the ER yesterday but left early this morning due to long wait times and having to get back home.  She continues to have pain.  She denies any cough, fever, vomiting.  Pain will may be related to her abdomen?  Work-up yesterday with 2 negative troponins, unremarkable chest x-ray as well as CMP, CBC.  Her EKG today shows normal sinus rhythm, no STEMI.  Will obtain repeat labs and reassess.  Troponin today is negative.  D-dimer is negative, we are unable to use [facility] for exclusion criteria to rule out PE however negative D-dimer is reassuring with low suspicion for PE because of this.  CMP and CBC remain unremarkable, she does have slight hypokalemia of 3.3 which was repleted orally here.  I suspect that her symptoms could be due to reflux.  I doubt ACS based on her reassuring work-up yesterday and today, nonischemic EKG.  No structural cause seen on imaging yesterday.  Abdomen is soft so I doubt appendicitis, cholecystitis or other surgical emergent cause of her symptoms.  Patient is agreeable to the plan.  Return precautions given.   Patient is hemodynamically stable, in NAD, and able to ambulate in the ED. Evaluation does not show pathology  that would require ongoing emergent intervention or inpatient treatment. I explained the diagnosis to the patient. Pain has been managed and has no complaints prior to discharge. Patient is comfortable with above plan and is stable for discharge at this time. All questions were answered prior to disposition. Strict return precautions for returning to the ED were discussed. Encouraged follow up with PCP.   An After Visit Summary was printed and given to the patient.   Portions of this note were generated with Scientist, clinical (histocompatibility and immunogenetics). Dictation errors may occur despite best attempts at proofreading.  Final Clinical Impression(s) / ED Diagnoses Final diagnoses:  Chest wall pain  Hypokalemia    Rx / DC Orders ED Discharge Orders          Ordered    famotidine (PEPCID) 20 MG tablet  2 times daily        12/06/20 1333             Dietrich Pates, PA-C 12/06/20 1336    Virgina Norfolk, DO 12/06/20 1430

## 2022-02-28 ENCOUNTER — Ambulatory Visit: Payer: Commercial Managed Care - HMO | Admitting: Nurse Practitioner

## 2022-03-08 ENCOUNTER — Encounter: Payer: Self-pay | Admitting: Nurse Practitioner

## 2022-03-08 ENCOUNTER — Ambulatory Visit (INDEPENDENT_AMBULATORY_CARE_PROVIDER_SITE_OTHER): Payer: Commercial Managed Care - HMO | Admitting: Nurse Practitioner

## 2022-03-08 VITALS — BP 150/80 | HR 52 | Temp 97.8°F | Ht 66.0 in | Wt 139.0 lb

## 2022-03-08 DIAGNOSIS — M79604 Pain in right leg: Secondary | ICD-10-CM

## 2022-03-08 DIAGNOSIS — Z720 Tobacco use: Secondary | ICD-10-CM | POA: Diagnosis not present

## 2022-03-08 DIAGNOSIS — R079 Chest pain, unspecified: Secondary | ICD-10-CM | POA: Diagnosis not present

## 2022-03-08 DIAGNOSIS — Z114 Encounter for screening for human immunodeficiency virus [HIV]: Secondary | ICD-10-CM

## 2022-03-08 DIAGNOSIS — M79605 Pain in left leg: Secondary | ICD-10-CM | POA: Insufficient documentation

## 2022-03-08 DIAGNOSIS — R030 Elevated blood-pressure reading, without diagnosis of hypertension: Secondary | ICD-10-CM | POA: Diagnosis not present

## 2022-03-08 DIAGNOSIS — Z1159 Encounter for screening for other viral diseases: Secondary | ICD-10-CM

## 2022-03-08 DIAGNOSIS — Z13220 Encounter for screening for lipoid disorders: Secondary | ICD-10-CM

## 2022-03-08 DIAGNOSIS — Z23 Encounter for immunization: Secondary | ICD-10-CM | POA: Diagnosis not present

## 2022-03-08 DIAGNOSIS — N951 Menopausal and female climacteric states: Secondary | ICD-10-CM

## 2022-03-08 DIAGNOSIS — R7301 Impaired fasting glucose: Secondary | ICD-10-CM

## 2022-03-08 NOTE — Assessment & Plan Note (Signed)
She states that she only smokes 1 to 2 cigarettes every few days and this is only when she drinks alcohol.  Encourage complete tobacco cessation.

## 2022-03-08 NOTE — Assessment & Plan Note (Addendum)
She has been experiencing intermittent chest pain for the past few years.  She states that this happens about once a week and is associated with shortness of breath.  She describes it as a throbbing, pressure.  She was evaluated for this in the emergency room in 2022.  Will check CMP, CBC, TSH and EKG today. EKG showed sinus bradycardia with a heart rate of 52. No ST or T wave changes. Referral placed to cardiology.  Follow-up in 4 weeks.

## 2022-03-08 NOTE — Patient Instructions (Signed)
It was great to see you!  We are checking your labs today and will let you know the results via mychart/phone.   Try to limit the amount of salt you are eating in your diet.   Start the attached stretches daily for your leg.   I have placed a referral to cardiology.   Let's follow-up in 4 weeks, sooner if you have concerns.  If a referral was placed today, you will be contacted for an appointment. Please note that routine referrals can sometimes take up to 3-4 weeks to process. Please call our office if you haven't heard anything after this time frame.  Take care,  Rodman Pickle, NP

## 2022-03-08 NOTE — Assessment & Plan Note (Signed)
Blood pressure is elevated at 150/80.  Discussed limiting salt in her diet.  She can check her blood pressure at home.  Follow-up in 4 weeks.

## 2022-03-08 NOTE — Progress Notes (Signed)
New Patient Visit  BP (!) 150/80 (BP Location: Right Arm, Patient Position: Sitting)   Pulse (!) 52   Temp 97.8 F (36.6 C) (Skin)   Ht 5\' 6"  (1.676 m)   Wt 139 lb (63 kg)   SpO2 97%   BMI 22.44 kg/m    Subjective:    Patient ID: Deborah Wyatt, female    DOB: 07-08-1970, 51 y.o.   MRN: [id number]  CC: Chief Complaint  Patient presents with   Chest Pain    Pt states that sometimes she has chest pain from time to time. Pt states that she has concerns about her irregular menstrual cycle.     HPI: Deborah Wyatt is a 51 y.o. female presents for new patient visit to establish care.  Introduced to 44 role and practice setting.  All questions answered.  Discussed provider/patient relationship and expectations.  Bevelyn ANTWONETTE FELIZ has not been to a health care provider in 20 years.   She has been having chest pain intermittently for the last few years. She states that when it was really bad, she went to the ER. It comes on the left side of her chest and describes it as a throbbing, pressure. She endorses shortness of breath when the pain comes. The pain occurs about once a week.   She has been having hot flashes and irregular menstrual periods. Her last bleeding episode was 2 days ago, and before that was 6 months ago.   She has also been noticing right leg pain and feels like is going to give out and make her fall. The pain is more in her right thigh muscle. She has not fallen. It typically happens after standing too long.   Depression and Anxiety Screen Done:     03/08/2022    2:41 PM  Depression screen PHQ 2/9  Decreased Interest 3  Down, Depressed, Hopeless 1  PHQ - 2 Score 4  Altered sleeping 3  Change in appetite 0  Feeling bad or failure about yourself  1  Trouble concentrating 0  Moving slowly or fidgety/restless 3  Suicidal thoughts 0  PHQ-9 Score 11       No data to display          Past Medical History:  Diagnosis Date   Heart murmur     as a baby    History reviewed. No pertinent surgical history.  Family History  Family history unknown: Yes     Social History   Tobacco Use   Smoking status: Some Days    Packs/day: 0.25    Types: Cigarettes   Smokeless tobacco: Never   Tobacco comments:    1 or 2 cigarettes every few days  Vaping Use   Vaping Use: Never used  Substance Use Topics   Alcohol use: Yes    Comment: Ocassional   Drug use: No    Current Outpatient Medications on File Prior to Visit  Medication Sig Dispense Refill   famotidine (PEPCID) 20 MG tablet Take 1 tablet (20 mg total) by mouth 2 (two) times daily. 30 tablet 0   No current facility-administered medications on file prior to visit.     Review of Systems  Constitutional:  Positive for fatigue. Negative for fever.  HENT: Negative.    Eyes:  Positive for visual disturbance. Negative for pain and redness.  Respiratory: Negative.    Cardiovascular:  Positive for chest pain (intermittent).  Gastrointestinal: Negative.   Genitourinary: Negative.   Musculoskeletal:  Right thigh pain/weakness  Skin: Negative.   Neurological: Negative.   Psychiatric/Behavioral:  Positive for dysphoric mood. The patient is not nervous/anxious.       Objective:    BP (!) 150/80 (BP Location: Right Arm, Patient Position: Sitting)   Pulse (!) 52   Temp 97.8 F (36.6 C) (Skin)   Ht 5\' 6"  (1.676 m)   Wt 139 lb (63 kg)   SpO2 97%   BMI 22.44 kg/m   Wt Readings from Last 3 Encounters:  03/08/22 139 lb (63 kg)  10/18/16 140 lb (63.5 kg)  07/09/13 140 lb (63.5 kg)    BP Readings from Last 3 Encounters:  03/08/22 (!) 150/80  12/06/20 (!) 103/49  12/06/20 (!) 113/57    Physical Exam Vitals and nursing note reviewed.  Constitutional:      General: She is not in acute distress.    Appearance: Normal appearance.  HENT:     Head: Normocephalic and atraumatic.     Right Ear: Tympanic membrane, ear canal and external ear normal.     Left Ear:  Tympanic membrane, ear canal and external ear normal.  Eyes:     Conjunctiva/sclera: Conjunctivae normal.  Cardiovascular:     Rate and Rhythm: Normal rate and regular rhythm.     Pulses: Normal pulses.     Heart sounds: Normal heart sounds.  Pulmonary:     Effort: Pulmonary effort is normal.     Breath sounds: Normal breath sounds.  Abdominal:     Palpations: Abdomen is soft.     Tenderness: There is no abdominal tenderness.  Musculoskeletal:        General: Normal range of motion.     Cervical back: Normal range of motion and neck supple.     Right lower leg: No edema.     Left lower leg: No edema.  Lymphadenopathy:     Cervical: No cervical adenopathy.  Skin:    General: Skin is warm and dry.  Neurological:     General: No focal deficit present.     Mental Status: She is alert and oriented to person, place, and time.     Cranial Nerves: No cranial nerve deficit.     Motor: No weakness.     Coordination: Coordination normal.     Gait: Gait normal.  Psychiatric:        Mood and Affect: Mood normal.        Behavior: Behavior normal.        Thought Content: Thought content normal.        Judgment: Judgment normal.       Assessment & Plan:   Problem List Items Addressed This Visit       Other   Right leg pain    She has been experiencing right thigh pain and weakness, feeling like her leg is going to buckle and give out.  This typically happens if she stands on her legs too long.  Will give her some stretches to do daily to help strengthen her quadriceps muscle.  Follow-up in 4 weeks.      Chest pain - Primary    She has been experiencing intermittent chest pain for the past few years.  She states that this happens about once a week and is associated with shortness of breath.  She describes it as a throbbing, pressure.  She was evaluated for this in the emergency room in 2022.  Will check CMP, CBC, TSH and EKG today. EKG showed  sinus bradycardia with a heart rate of 52.  No ST or T wave changes. Referral placed to cardiology.  Follow-up in 4 weeks.      Relevant Orders   EKG 12-Lead (Completed)   CBC   Comprehensive metabolic panel   TSH   Elevated blood pressure reading    Blood pressure is elevated at 150/80.  Discussed limiting salt in her diet.  She can check her blood pressure at home.  Follow-up in 4 weeks.      Tobacco use    She states that she only smokes 1 to 2 cigarettes every few days and this is only when she drinks alcohol.  Encourage complete tobacco cessation.      Other Visit Diagnoses     IFG (impaired fasting glucose)       Glucose has been elevated with her in the past, will check A1c and CMP today   Relevant Orders   Hemoglobin A1c   Screening, lipid       Screen lipid panel today   Relevant Orders   Lipid panel   Screening for HIV (human immunodeficiency virus)       Screen for HIV today   Relevant Orders   HIV Antibody (routine testing w rflx)   Encounter for hepatitis C screening test for low risk patient       Screen for hepatitis C today   Relevant Orders   Hepatitis C antibody   Perimenopause       She is currently experiencing hot flashes and irregular menses.  Discussed she is going through perimenopause and what to expect   Need for Td vaccine       TD booster updated today   Relevant Orders   Td vaccine greater than or equal to 7yo preservative free IM (Completed)        Follow up plan: Return in about 4 weeks (around 04/05/2022) for CPE.

## 2022-03-08 NOTE — Assessment & Plan Note (Signed)
She has been experiencing right thigh pain and weakness, feeling like her leg is going to buckle and give out.  This typically happens if she stands on her legs too long.  Will give her some stretches to do daily to help strengthen her quadriceps muscle.  Follow-up in 4 weeks.

## 2022-03-08 NOTE — Progress Notes (Signed)
EKG interpreted by me on 03/08/22 showed sinus bradycardia with a heart rate of 52. No ST or T wave changes.

## 2022-03-09 LAB — LIPID PANEL
Cholesterol: 219 mg/dL — ABNORMAL HIGH (ref <200)
HDL: 138 mg/dL (ref >=50)
LDL Cholesterol (Calc): 66 mg/dL (calc)
Non-HDL Cholesterol (Calc): 81 mg/dL (calc) (ref <130)
Total CHOL/HDL Ratio: 1.6 (calc) (ref <5.0)
Triglycerides: 71 mg/dL (ref <150)

## 2022-03-09 LAB — HEMOGLOBIN A1C
Hgb A1c MFr Bld: 5.3 % of total Hgb (ref <5.7)
Mean Plasma Glucose: 105 mg/dL
eAG (mmol/L): 5.8 mmol/L

## 2022-03-09 LAB — CBC
HCT: 42.1 % (ref 35.0–45.0)
Hemoglobin: 13.1 g/dL (ref 11.7–15.5)
MCH: 25.2 pg — ABNORMAL LOW (ref 27.0–33.0)
MCHC: 31.1 g/dL — ABNORMAL LOW (ref 32.0–36.0)
MCV: 81.1 fL (ref 80.0–100.0)
MPV: 11.8 fL (ref 7.5–12.5)
Platelets: 253 10*3/uL (ref 140–400)
RBC: 5.19 10*6/uL — ABNORMAL HIGH (ref 3.80–5.10)
RDW: 14.3 % (ref 11.0–15.0)
WBC: 3.5 10*3/uL — ABNORMAL LOW (ref 3.8–10.8)

## 2022-03-09 LAB — COMPREHENSIVE METABOLIC PANEL
AG Ratio: 1.6 (calc) (ref 1.0–2.5)
ALT: 14 U/L (ref 6–29)
AST: 15 U/L (ref 10–35)
Albumin: 4.9 g/dL (ref 3.6–5.1)
Alkaline phosphatase (APISO): 65 U/L (ref 37–153)
BUN: 10 mg/dL (ref 7–25)
CO2: 24 mmol/L (ref 20–32)
Calcium: 10.3 mg/dL (ref 8.6–10.4)
Chloride: 101 mmol/L (ref 98–110)
Creat: 0.58 mg/dL (ref 0.50–1.03)
Globulin: 3 g/dL (calc) (ref 1.9–3.7)
Glucose, Bld: 71 mg/dL (ref 65–99)
Potassium: 4.7 mmol/L (ref 3.5–5.3)
Sodium: 138 mmol/L (ref 135–146)
Total Bilirubin: 1 mg/dL (ref 0.2–1.2)
Total Protein: 7.9 g/dL (ref 6.1–8.1)

## 2022-03-09 LAB — TSH: TSH: 1.06 mIU/L

## 2022-03-09 LAB — SPECIMEN COMPROMISED

## 2022-03-09 LAB — HIV ANTIBODY (ROUTINE TESTING W REFLEX): HIV 1&2 Ab, 4th Generation: NONREACTIVE

## 2022-03-09 LAB — HEPATITIS C ANTIBODY: Hepatitis C Ab: NONREACTIVE

## 2022-04-04 NOTE — Progress Notes (Signed)
BP 118/68   Pulse 60   Ht 5\' 6"  (1.676 m)   Wt 135 lb 9.6 oz (61.5 kg)   SpO2 98%   BMI 21.89 kg/m    Subjective:    Patient ID: Deborah Wyatt, female    DOB: [birth year], 52 y.o.   MRN: [id number]  CC: Chief Complaint  Patient presents with   Annual Exam    Physical  , pt has no concerns     HPI: Deborah Wyatt is a 52 y.o. female presenting on 04/05/2022 for comprehensive medical examination. Current medical complaints include:none  She states that she is still having some of the chest pain, however not as frequently and her leg still hurts and feels like it will buckle, but it is doing better since starting the exercises. The chest pain still feels like a throbbing and pressure. She denies shortness of breath.   She currently lives with: daughter Menopausal Symptoms: yes - hot flashes, irregular periods  Depression Screen done and results listed below:     03/08/2022    2:41 PM  Depression screen PHQ 2/9  Decreased Interest 3  Down, Depressed, Hopeless 1  PHQ - 2 Score 4  Altered sleeping 3  Change in appetite 0  Feeling bad or failure about yourself  1  Trouble concentrating 0  Moving slowly or fidgety/restless 3  Suicidal thoughts 0  PHQ-9 Score 11    The patient does not have a history of falls. I did not complete a risk assessment for falls. A plan of care for falls was not documented.   Past Medical History:  Past Medical History:  Diagnosis Date   Heart murmur    as a baby    Surgical History:  History reviewed. No pertinent surgical history.  Medications:  Current Outpatient Medications on File Prior to Visit  Medication Sig   famotidine (PEPCID) 20 MG tablet Take 1 tablet (20 mg total) by mouth 2 (two) times daily.   No current facility-administered medications on file prior to visit.    Allergies:  No Known Allergies  Social History:  Social History   Socioeconomic History   Marital status: Single    Spouse name: Not on file    Number of children: Not on file   Years of education: Not on file   Highest education level: Not on file  Occupational History   Not on file  Tobacco Use   Smoking status: Some Days    Packs/day: 0.25    Types: Cigarettes   Smokeless tobacco: Never   Tobacco comments:    1 or 2 cigarettes every few days  Vaping Use   Vaping Use: Never used  Substance and Sexual Activity   Alcohol use: Yes    Comment: Ocassional   Drug use: No   Sexual activity: Yes    Birth control/protection: None  Other Topics Concern   Not on file  Social History Narrative   Not on file   Social Determinants of Health   Financial Resource Strain: Not on file  Food Insecurity: Not on file  Transportation Needs: Not on file  Physical Activity: Not on file  Stress: Not on file  Social Connections: Not on file  Intimate Partner Violence: Not on file   Social History   Tobacco Use  Smoking Status Some Days   Packs/day: 0.25   Types: Cigarettes  Smokeless Tobacco Never  Tobacco Comments   1 or 2 cigarettes every few days  Social History   Substance and Sexual Activity  Alcohol Use Yes   Comment: Ocassional    Family History:  Family History  Family history unknown: Yes    Past medical history, surgical history, medications, allergies, family history and social history reviewed with patient today and changes made to appropriate areas of the chart.   Review of Systems  Constitutional:  Positive for malaise/fatigue.  HENT: Negative.    Eyes: Negative.   Respiratory: Negative.    Cardiovascular:  Positive for chest pain (intermitent).  Gastrointestinal: Negative.   Genitourinary: Negative.   Musculoskeletal: Negative.   Skin: Negative.   Neurological: Negative.   Psychiatric/Behavioral: Negative.     All other ROS negative except what is listed above and in the HPI.      Objective:    BP 118/68   Pulse 60   Ht 5\' 6"  (1.676 m)   Wt 135 lb 9.6 oz (61.5 kg)   SpO2 98%   BMI  21.89 kg/m   Wt Readings from Last 3 Encounters:  04/05/22 135 lb 9.6 oz (61.5 kg)  03/08/22 139 lb (63 kg)  10/18/16 140 lb (63.5 kg)    Physical Exam Vitals and nursing note reviewed. Exam conducted with a chaperone present.  Constitutional:      General: She is not in acute distress.    Appearance: Normal appearance.  HENT:     Head: Normocephalic and atraumatic.     Right Ear: Tympanic membrane, ear canal and external ear normal.     Left Ear: Tympanic membrane, ear canal and external ear normal.  Eyes:     Conjunctiva/sclera: Conjunctivae normal.  Cardiovascular:     Rate and Rhythm: Normal rate and regular rhythm.     Pulses: Normal pulses.     Heart sounds: Normal heart sounds.  Pulmonary:     Effort: Pulmonary effort is normal.     Breath sounds: Normal breath sounds.  Abdominal:     Palpations: Abdomen is soft.     Tenderness: There is no abdominal tenderness.  Genitourinary:    General: Normal vulva.     Exam position: Lithotomy position.     Labia:        Right: No rash, tenderness or lesion.        Left: No rash, tenderness or lesion.      Vagina: Normal.     Cervix: Normal.     Uterus: Normal.   Musculoskeletal:        General: Normal range of motion.     Cervical back: Normal range of motion and neck supple.     Right lower leg: No edema.     Left lower leg: No edema.  Lymphadenopathy:     Cervical: No cervical adenopathy.  Skin:    General: Skin is warm and dry.  Neurological:     General: No focal deficit present.     Mental Status: She is alert and oriented to person, place, and time.     Cranial Nerves: No cranial nerve deficit.     Coordination: Coordination normal.     Gait: Gait normal.  Psychiatric:        Mood and Affect: Mood normal.        Behavior: Behavior normal.        Thought Content: Thought content normal.        Judgment: Judgment normal.     Results for orders placed or performed in visit on 03/08/22  CBC  Result  Value  Ref Range   WBC 3.5 (L) 3.8 - 10.8 Thousand/uL   RBC 5.19 (H) 3.80 - 5.10 Million/uL   Hemoglobin 13.1 11.7 - 15.5 g/dL   HCT 36.6 44.0 - 34.7 %   MCV 81.1 80.0 - 100.0 fL   MCH 25.2 (L) 27.0 - 33.0 pg   MCHC 31.1 (L) 32.0 - 36.0 g/dL   RDW 42.5 95.6 - 38.7 %   Platelets 253 140 - 400 Thousand/uL   MPV 11.8 7.5 - 12.5 fL  Comprehensive metabolic panel  Result Value Ref Range   Glucose, Bld 71 65 - 99 mg/dL   BUN 10 7 - 25 mg/dL   Creat 5.64 3.32 - 9.51 mg/dL   BUN/Creatinine Ratio SEE NOTE: 6 - 22 (calc)   Sodium 138 135 - 146 mmol/L   Potassium 4.7 3.5 - 5.3 mmol/L   Chloride 101 98 - 110 mmol/L   CO2 24 20 - 32 mmol/L   Calcium 10.3 8.6 - 10.4 mg/dL   Total Protein 7.9 6.1 - 8.1 g/dL   Albumin 4.9 3.6 - 5.1 g/dL   Globulin 3.0 1.9 - 3.7 g/dL (calc)   AG Ratio 1.6 1.0 - 2.5 (calc)   Total Bilirubin 1.0 0.2 - 1.2 mg/dL   Alkaline phosphatase (APISO) 65 37 - 153 U/L   AST 15 10 - 35 U/L   ALT 14 6 - 29 U/L  Hemoglobin A1c  Result Value Ref Range   Hgb A1c MFr Bld 5.3 <5.7 % of total Hgb   Mean Plasma Glucose 105 mg/dL   eAG (mmol/L) 5.8 mmol/L  TSH  Result Value Ref Range   TSH 1.06 mIU/L  Lipid panel  Result Value Ref Range   Cholesterol 219 (H) <200 mg/dL   HDL 884 > OR = 50 mg/dL   Triglycerides 71 <166 mg/dL   LDL Cholesterol (Calc) 66 mg/dL (calc)   Total CHOL/HDL Ratio 1.6 <5.0 (calc)   Non-HDL Cholesterol (Calc) 81 <063 mg/dL (calc)  Hepatitis C antibody  Result Value Ref Range   Hepatitis C Ab NON-REACTIVE NON-REACTIVE  HIV Antibody (routine testing w rflx)  Result Value Ref Range   HIV 1&2 Ab, 4th Generation NON-REACTIVE NON-REACTIVE  SPECIMEN COMPROMISED  Result Value Ref Range   Specimen Integrity        Assessment & Plan:   Problem List Items Addressed This Visit       Other   Right leg pain    Ongoing, it is doing slightly better since starting the exercises at home. Continue doing them daily and will refer to PT if not improving.        Chest pain    She has been still having intermittent chest pain, however not as frequently. EKG done last visit and reviewed again today. Schedule appointment with cardiology.       Relevant Orders   Ambulatory referral to Cardiology   Other Visit Diagnoses     Routine general medical examination at a health care facility    -  Primary   Health maintenance reviewed and updated. Discussed nutrition, exercise. She would like to think about colon cancer screening. Follow-up 1 year   Screening for cervical cancer       Pap with HPV done today   Relevant Orders   Cytology - PAP   Encounter for screening mammogram for malignant neoplasm of breast       Mammogram ordered today   Relevant Orders   MM 3D SCREEN BREAST  BILATERAL        Follow up plan: Return in about 6 months (around 10/04/2022) for right leg pain.   LABORATORY TESTING:  - Pap smear: pap done  IMMUNIZATIONS:   - Tdap: Tetanus vaccination status reviewed: last tetanus booster within 10 years. - Influenza: Refused - Pneumovax: Not applicable - Prevnar: Not applicable - HPV: Not applicable - Zostavax vaccine: Refused  SCREENING: -Mammogram: Ordered today  - Colonoscopy: Refused  - Bone Density: Not applicable  -Hearing Test: Not applicable  -Spirometry: Not applicable   PATIENT COUNSELING:   Advised to take 1 mg of folate supplement per day if capable of pregnancy.   Sexuality: Discussed sexually transmitted diseases, partner selection, use of condoms, avoidance of unintended pregnancy  and contraceptive alternatives.   Advised to avoid cigarette smoking.  I discussed with the patient that most people either abstain from alcohol or drink within safe limits (<=14/week and <=4 drinks/occasion for males, <=7/weeks and <= 3 drinks/occasion for females) and that the risk for alcohol disorders and other health effects rises proportionally with the number of drinks per week and how often a drinker exceeds daily  limits.  Discussed cessation/primary prevention of drug use and availability of treatment for abuse.   Diet: Encouraged to adjust caloric intake to maintain  or achieve ideal body weight, to reduce intake of dietary saturated fat and total fat, to limit sodium intake by avoiding high sodium foods and not adding table salt, and to maintain adequate dietary potassium and calcium preferably from fresh fruits, vegetables, and low-fat dairy products.    stressed the importance of regular exercise  Injury prevention: Discussed safety belts, safety helmets, smoke detector, smoking near bedding or upholstery.   Dental health: Discussed importance of regular tooth brushing, flossing, and dental visits.    NEXT PREVENTATIVE PHYSICAL DUE IN 1 YEAR. Return in about 6 months (around 10/04/2022) for right leg pain.

## 2022-04-05 ENCOUNTER — Other Ambulatory Visit (HOSPITAL_COMMUNITY)
Admission: RE | Admit: 2022-04-05 | Discharge: 2022-04-05 | Disposition: A | Discharge status: Home / Self Care | Payer: No Typology Code available for payment source | Source: Ambulatory Visit | Attending: Nurse Practitioner | Admitting: Nurse Practitioner

## 2022-04-05 ENCOUNTER — Encounter: Payer: Self-pay | Admitting: Nurse Practitioner

## 2022-04-05 ENCOUNTER — Ambulatory Visit (INDEPENDENT_AMBULATORY_CARE_PROVIDER_SITE_OTHER): Payer: Commercial Managed Care - HMO | Admitting: Nurse Practitioner

## 2022-04-05 VITALS — BP 118/68 | HR 60 | Ht 66.0 in | Wt 135.6 lb

## 2022-04-05 DIAGNOSIS — Z0000 Encounter for general adult medical examination without abnormal findings: Secondary | ICD-10-CM | POA: Diagnosis not present

## 2022-04-05 DIAGNOSIS — R079 Chest pain, unspecified: Secondary | ICD-10-CM | POA: Diagnosis not present

## 2022-04-05 DIAGNOSIS — Z124 Encounter for screening for malignant neoplasm of cervix: Secondary | ICD-10-CM | POA: Insufficient documentation

## 2022-04-05 DIAGNOSIS — M79604 Pain in right leg: Secondary | ICD-10-CM

## 2022-04-05 DIAGNOSIS — Z1231 Encounter for screening mammogram for malignant neoplasm of breast: Secondary | ICD-10-CM | POA: Diagnosis not present

## 2022-04-05 NOTE — Assessment & Plan Note (Signed)
Ongoing, it is doing slightly better since starting the exercises at home. Continue doing them daily and will refer to PT if not improving.

## 2022-04-05 NOTE — Patient Instructions (Signed)
It was great to see you!  Come here to our office on 05/27/21 at 11am for your mammogram.   Keep doing the stretches for your leg.   Let's follow-up in 6 months, sooner if you have concerns.  If a referral was placed today, you will be contacted for an appointment. Please note that routine referrals can sometimes take up to 3-4 weeks to process. Please call our office if you haven't heard anything after this time frame.  Take care,  Vance Peper, NP

## 2022-04-05 NOTE — Assessment & Plan Note (Signed)
She has been still having intermittent chest pain, however not as frequently. EKG done last visit and reviewed again today. Schedule appointment with cardiology.

## 2022-04-10 LAB — CYTOLOGY - PAP
Comment: NEGATIVE
Diagnosis: UNDETERMINED — AB
High risk HPV: NEGATIVE

## 2022-05-27 ENCOUNTER — Inpatient Hospital Stay: Admission: RE | Admit: 2022-05-27 | Payer: Commercial Managed Care - HMO | Source: Ambulatory Visit

## 2022-05-28 ENCOUNTER — Inpatient Hospital Stay: Admission: RE | Admit: 2022-05-28 | Payer: Commercial Managed Care - HMO | Source: Ambulatory Visit

## 2022-06-10 ENCOUNTER — Telehealth: Payer: Self-pay | Admitting: Nurse Practitioner

## 2022-06-10 NOTE — Telephone Encounter (Signed)
I spoke with patient and she thought that she had a TB skin test done here but she did not. She is scheduled to come in the office for a test tomorrow.

## 2022-06-10 NOTE — Telephone Encounter (Signed)
Caller Name: Cadyn Call back phone #: [phone]  Reason for Call: Pt needs a hard copy of her TB test results. She would like to come pick it up. She needs it today if possible.  Please call her when it is ready.

## 2022-06-10 NOTE — Telephone Encounter (Signed)
I spoke with patient and she is to come in the office tomorrow for an appointment.

## 2022-06-10 NOTE — Telephone Encounter (Signed)
Pt walked and said she need you to fax her last TB reading to [phone] . Pt said it is for a job and a phone number is [phone]. If any questions please give the pt a call

## 2022-06-11 ENCOUNTER — Ambulatory Visit (INDEPENDENT_AMBULATORY_CARE_PROVIDER_SITE_OTHER): Payer: Commercial Managed Care - HMO

## 2022-06-11 DIAGNOSIS — Z111 Encounter for screening for respiratory tuberculosis: Secondary | ICD-10-CM

## 2022-06-11 NOTE — Progress Notes (Signed)
PPD Placement note Deborah Wyatt, 52 y.o. female is here today for placement of PPD test Reason for PPD test: work Pt taken PPD test before: yes Verified in allergy area and with patient that they are not allergic to the products PPD is made of (Phenol or Tween). Yes Is patient taking any oral or IV steroid medication now or have they taken it in the last month? no Has the patient ever received the BCG vaccine?: no Has the patient been in recent contact with anyone known or suspected of having active TB disease?: no      Date of exposure (if applicable): n/a      Name of person they were exposed to (if applicable): n/a Patient's Country of origin?: [place] O: Alert and oriented in NAD. P:  PPD placed on 06/11/2022.  Patient advised to return for reading within 48-72 hours.   PPD placed left forearm and marked. PPD read scheduled 06/13/22 at 1040.

## 2022-06-13 ENCOUNTER — Ambulatory Visit (INDEPENDENT_AMBULATORY_CARE_PROVIDER_SITE_OTHER): Payer: Commercial Managed Care - HMO

## 2022-06-13 DIAGNOSIS — Z111 Encounter for screening for respiratory tuberculosis: Secondary | ICD-10-CM

## 2022-06-13 LAB — TB SKIN TEST: TB Skin Test: NEGATIVE

## 2022-06-13 NOTE — Progress Notes (Signed)
PPD Reading Note PPD read and results entered in [place]. Result: 0 mm induration. Interpretation: Negative  If test not read within 48-72 hours of initial placement, patient advised to repeat in other arm 1-3 weeks after this test. Allergic reaction: no   Read completed by [place] L.

## 2022-06-25 ENCOUNTER — Telehealth: Payer: Self-pay

## 2022-06-25 NOTE — Telephone Encounter (Signed)
LVM for pt to call the office to get rescheduled for Mobile Mammo here at [facility].

## 2022-10-04 ENCOUNTER — Encounter: Payer: Self-pay | Admitting: Nurse Practitioner

## 2022-10-04 ENCOUNTER — Ambulatory Visit (INDEPENDENT_AMBULATORY_CARE_PROVIDER_SITE_OTHER): Payer: Commercial Managed Care - HMO | Admitting: Nurse Practitioner

## 2022-10-04 ENCOUNTER — Telehealth: Payer: Self-pay | Admitting: Nurse Practitioner

## 2022-10-04 VITALS — BP 100/76 | HR 62 | Temp 98.1°F | Resp 16 | Ht 66.0 in | Wt 139.8 lb

## 2022-10-04 DIAGNOSIS — M25512 Pain in left shoulder: Secondary | ICD-10-CM

## 2022-10-04 DIAGNOSIS — M79605 Pain in left leg: Secondary | ICD-10-CM | POA: Diagnosis not present

## 2022-10-04 MED ORDER — MELOXICAM 15 MG PO TABS: 15.0000 mg | ORAL_TABLET | Freq: Every day | ORAL | 0 refills | Status: DC

## 2022-10-04 NOTE — Assessment & Plan Note (Signed)
She has been having pain and limited range of motion to her left shoulder for 1 week.  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  Will have her start meloxicam 15 mg daily with food and place referral to orthopedics.

## 2022-10-04 NOTE — Assessment & Plan Note (Addendum)
Chronic, ongoing.  She is still having ongoing pain to her left upper thigh.  She also describes some weakness and feeling like her leg is going to give out.  Will have her start meloxicam 15 mg daily as needed for pain.  Referral placed to orthopedics for further evaluation.

## 2022-10-04 NOTE — Progress Notes (Signed)
Established Patient Office Visit  Subjective   Patient ID: Deborah Wyatt, female    DOB: 04-15-1970  Age: 52 y.o. MRN: [id number]  Chief Complaint  Patient presents with   Pain    Right Leg pain and left shoulder pain     HPI  Deborah Wyatt is here to follow-up on left thigh pain and left shoulder pain.  She states that her left thigh pain was getting better, however it is still occurring.  She describes it as sharp and feels like her leg is weak and will buckle and give out.  She has been taking Tylenol, however is not really been helping.  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  The pain does not radiate and she denies swelling and redness.  She also started having pain in her left shoulder for the last week.  She describes this as sharp as well.  She is unable to lift her arm fully above her head.  She is unable to get comfortable sleeping at night.  She has tried Tylenol which has not helped.  The pain does not radiate and she denies swelling and redness.     ROS See pertinent positives and negatives per HPI.    Objective:     BP 100/76 (BP Location: Left Arm, Patient Position: Sitting, Cuff Size: Normal)   Pulse 62   Temp 98.1 F (36.7 C) (Temporal)   Resp 16   Ht 5\' 6"  (1.676 m)   Wt 139 lb 12.8 oz (63.4 kg)   SpO2 95%   BMI 22.56 kg/m    Physical Exam Vitals and nursing note reviewed.  Constitutional:      General: She is not in acute distress.    Appearance: Normal appearance.  HENT:     Head: Normocephalic.  Eyes:     Conjunctiva/sclera: Conjunctivae normal.  Pulmonary:     Effort: Pulmonary effort is normal.  Musculoskeletal:        General: Tenderness (left posterior shoulder) present. No swelling.     Cervical back: Normal range of motion.     Comments: Limited ROM to left shoulder. Negative empty can test  Skin:    General: Skin is warm.  Neurological:     General: No focal deficit present.      Mental Status: She is alert and oriented to person, place, and time.  Psychiatric:        Mood and Affect: Mood normal.        Behavior: Behavior normal.        Thought Content: Thought content normal.        Judgment: Judgment normal.       Assessment & Plan:   Problem List Items Addressed This Visit       Other   Left leg pain    Chronic, ongoing.  She is still having ongoing pain to her left upper thigh.  She also describes some weakness and feeling like her leg is going to give out.  Will have her start meloxicam 15 mg daily as needed for pain.  Referral placed to orthopedics for further evaluation.      Relevant Orders   Ambulatory referral to Orthopedic Surgery   Acute pain of left shoulder - Primary    She has been having pain and limited range of motion to her left shoulder for 1 week.  She states this started after being a Conservation officer, nature at [employer]  for a week.  Will have her start meloxicam 15 mg daily with food and place referral to orthopedics.      Relevant Orders   Ambulatory referral to Orthopedic Surgery    Return if symptoms worsen or fail to improve.    Gerre Scull, NP

## 2022-10-04 NOTE — Telephone Encounter (Signed)
error 

## 2022-10-04 NOTE — Patient Instructions (Signed)
It was great to see you!  Start meloxicam 1 tablet daily with food to help with the pain. You can also take tylenol as needed for pain.   I have placed a referral to orthopedics, they will call you to schedule.   Let's follow-up if your symptoms worsen or don't improve.   Take care,  Rodman Pickle, NP

## 2022-10-17 ENCOUNTER — Ambulatory Visit: Payer: Commercial Managed Care - HMO | Admitting: Orthopaedic Surgery

## 2023-01-13 IMAGING — DX DG CHEST 2V
2 series · 2 of 2 positions shown · non-contrast
Comparison: 09/28/2012

CLINICAL DATA: Chest pain and shortness of breath.

EXAM:
CHEST - 2 VIEW

[chest pa]
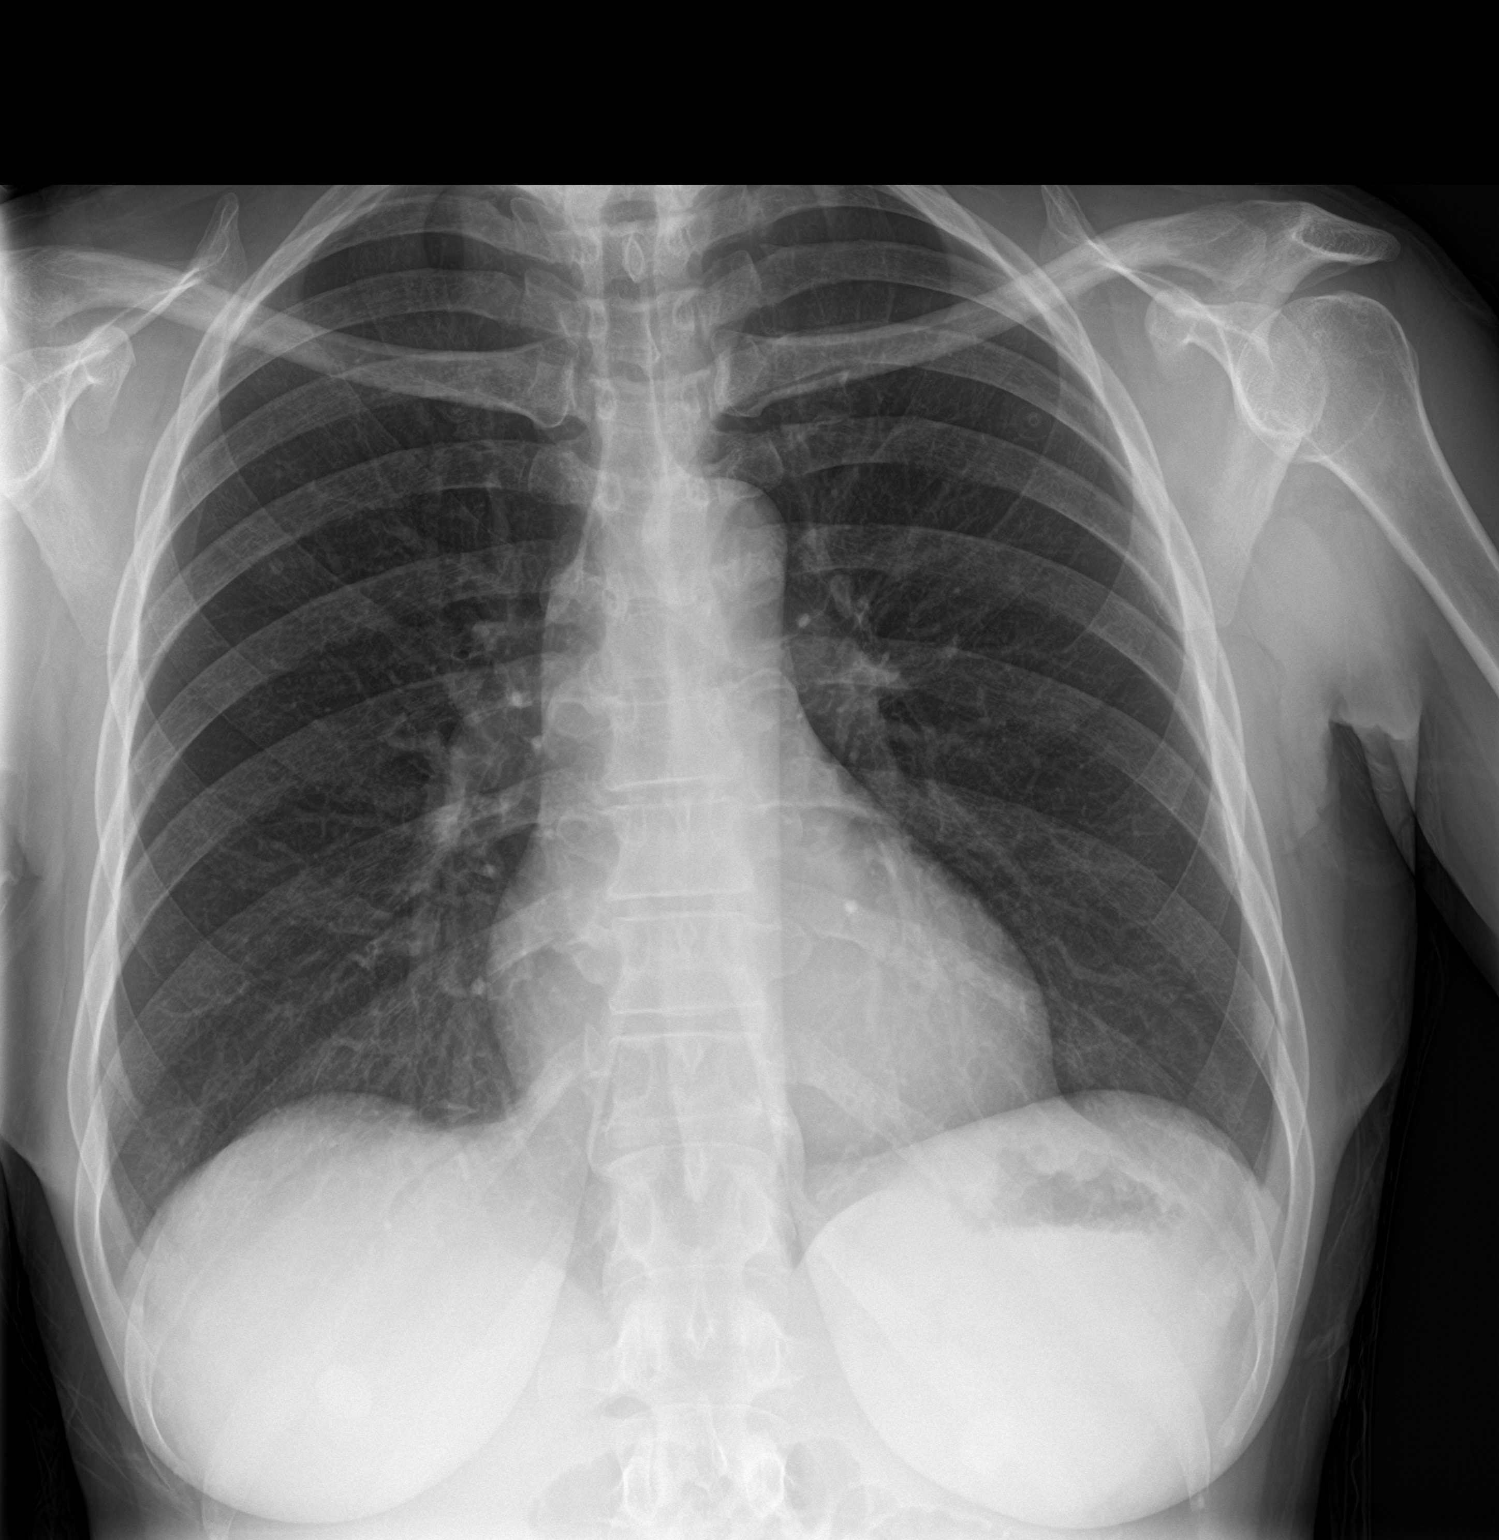

[chest lat]
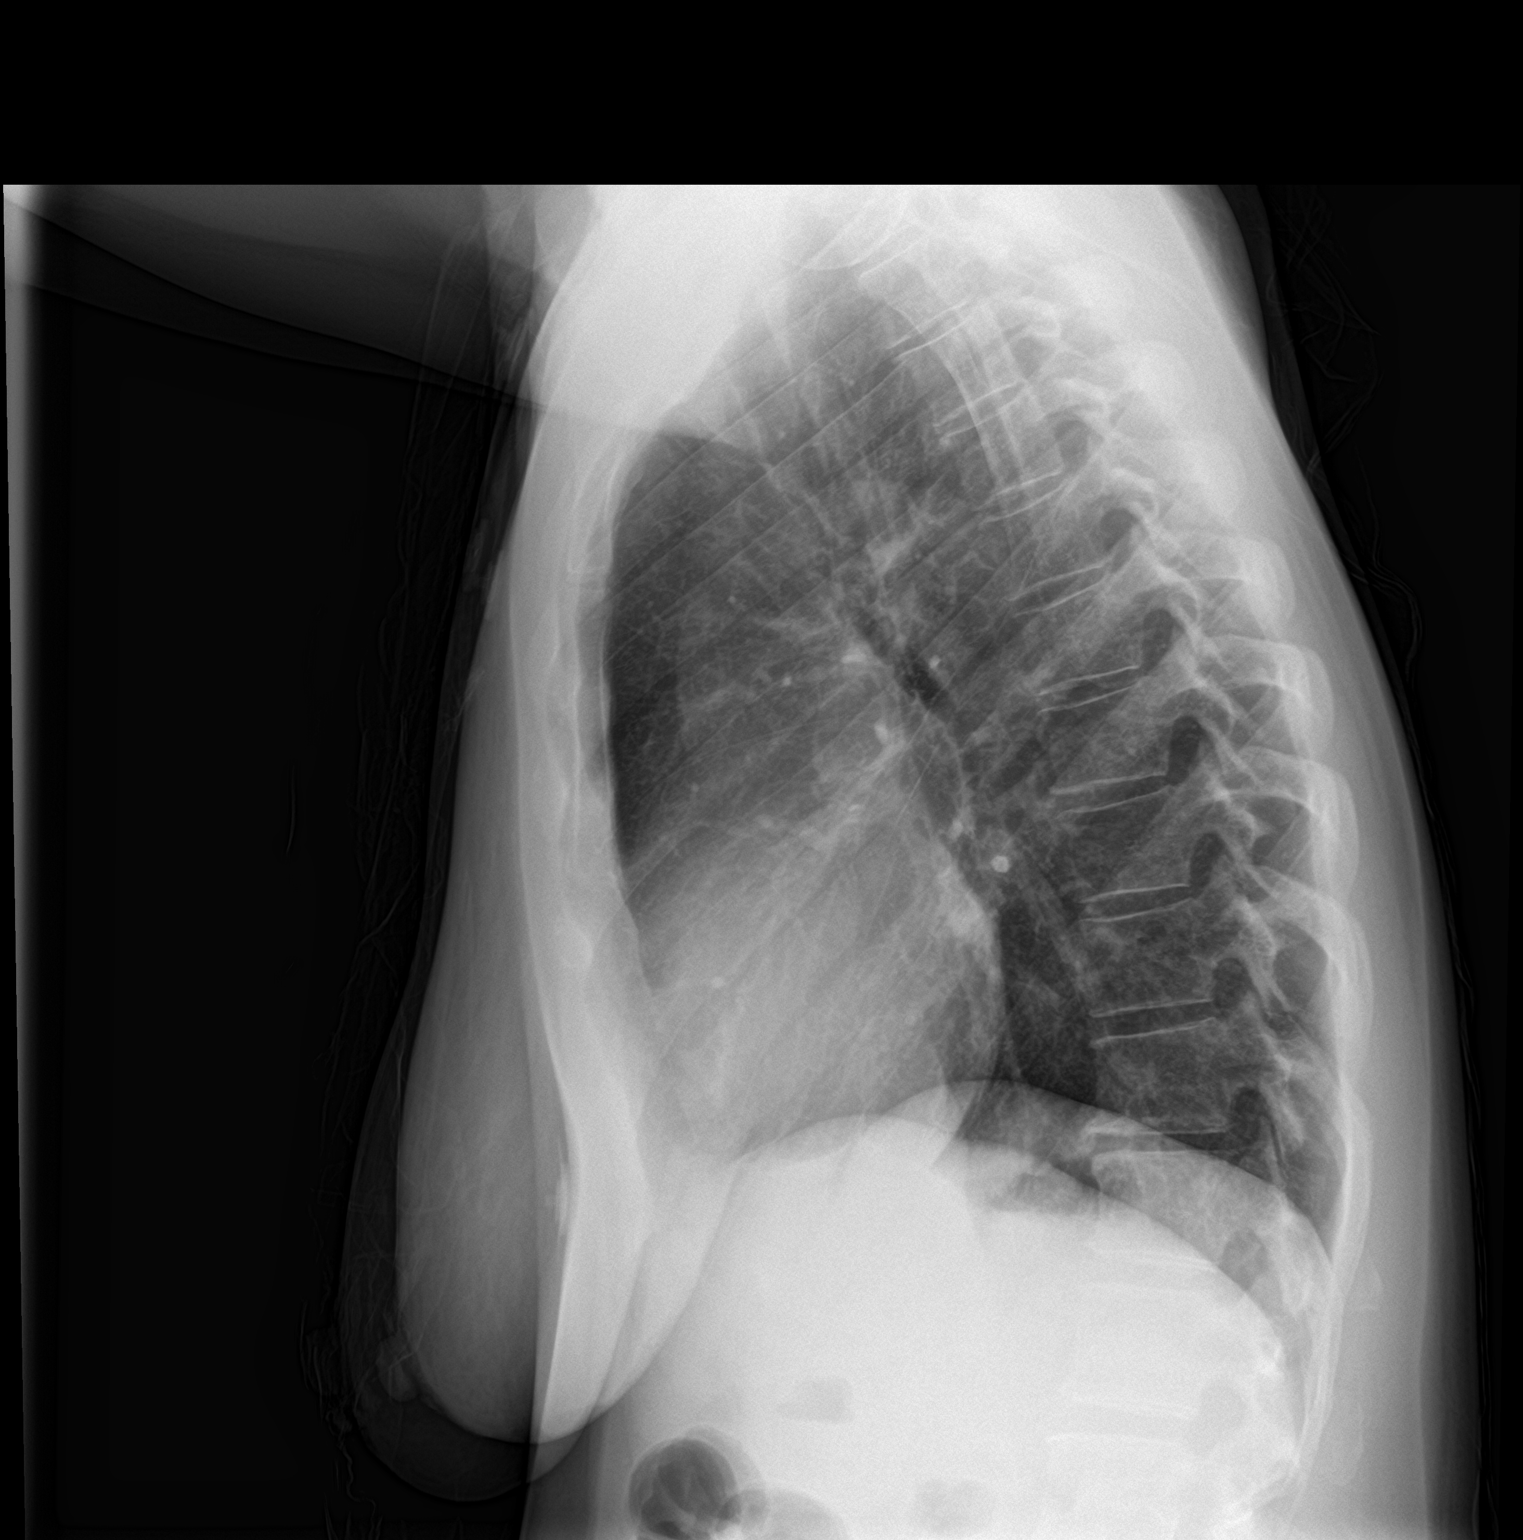

[2 of 2 positions shown; findings below may reference images not displayed]

FINDINGS: The cardiomediastinal contours are normal. The lungs are clear.
Pulmonary vasculature is normal. No consolidation, pleural effusion,
or pneumothorax. No acute osseous abnormalities are seen.
IMPRESSION: Negative radiographs of the chest.

## 2023-11-12 ENCOUNTER — Ambulatory Visit: Admitting: Nurse Practitioner

## 2023-11-19 ENCOUNTER — Ambulatory Visit (INDEPENDENT_AMBULATORY_CARE_PROVIDER_SITE_OTHER): Admitting: Nurse Practitioner

## 2023-11-19 ENCOUNTER — Encounter: Payer: Self-pay | Admitting: Nurse Practitioner

## 2023-11-19 VITALS — BP 122/64 | HR 69 | Temp 97.4°F | Ht 66.0 in | Wt 147.6 lb

## 2023-11-19 DIAGNOSIS — M79605 Pain in left leg: Secondary | ICD-10-CM

## 2023-11-19 DIAGNOSIS — M25512 Pain in left shoulder: Secondary | ICD-10-CM | POA: Diagnosis not present

## 2023-11-19 DIAGNOSIS — M5412 Radiculopathy, cervical region: Secondary | ICD-10-CM | POA: Insufficient documentation

## 2023-11-19 DIAGNOSIS — M545 Low back pain, unspecified: Secondary | ICD-10-CM

## 2023-11-19 MED ORDER — GABAPENTIN 300 MG PO CAPS: 300.0000 mg | ORAL_CAPSULE | Freq: Every day | ORAL | 1 refills | Status: AC

## 2023-11-19 NOTE — Patient Instructions (Signed)
 It was great to see you!  Call ortho to schedule an appointment:   [address]  [place] [facility] [id number]  [phone]   Start gabapentin  1 capsule at bedtime   Let's follow-up in 2 months, sooner if you have concerns.  If a referral was placed today, you will be contacted for an appointment. Please note that routine referrals can sometimes take up to 3-4 weeks to process. Please call our office if you haven't heard anything after this time frame.  Take care,  Tinnie Harada, NP

## 2023-11-19 NOTE — Assessment & Plan Note (Signed)
 Chronic, ongoing.  Pain is improving, although still there. Gave her the number to orthopedics to call and schedule an appointment.

## 2023-11-19 NOTE — Assessment & Plan Note (Signed)
 Left shoulder pain is improving and currently less concerning.

## 2023-11-19 NOTE — Assessment & Plan Note (Addendum)
 Bilateral hand numbness and pain, more severe on the left, likely due to cervical arthritis-related nerve compression. Hand grip strength equal, no neuro deficits. Start gabapentin  300mg  at bedtime for management. Refer to orthopedics for further evaluation. Follow-up in 2 months.

## 2023-11-19 NOTE — Progress Notes (Signed)
 Established Patient Office Visit  Subjective   Patient ID: Deborah Wyatt, female    DOB: [birth year]  Age: 53 y.o. MRN: [id number]  Chief Complaint  Patient presents with   Pain    Pain in both legs, lower back pain, and left hand pain that becomes numb at times    HPI Discussed the use of AI scribe software for clinical note transcription with the patient, who gave verbal consent to proceed.  History of Present Illness   Deborah Wyatt is a 53 year old female who presents with worsening shoulder and hand pain, and new lower back pain.  She experiences worsening shoulder pain and new pain in her hands, particularly the left, which sometimes becomes numb, tense, and aches. Numbness and tingling are more pronounced in the left hand, affecting all fingers and causing difficulty with gripping and opening objects. She denies slurred speech, confusion.   New onset lower back pain is localized to the lower back without radiation to the legs. She has not experienced neck pain but a neck x-ray from 2010 showing arthritis. Numbness and tingling occur sporadically at the bottoms of her feet. There is no knee pain or swelling in her hands.  For pain management, she takes extra strength Tylenol  after completing a prescription of meloxicam , which was somewhat effective. She has not had recent imaging or orthopedic follow-up due to previous insurance issues, which are now resolved.       ROS See pertinent positives and negatives per HPI.    Objective:     BP 122/64 (BP Location: Left Arm, Patient Position: Sitting, Cuff Size: Normal)   Pulse 69   Temp (!) 97.4 F (36.3 C)   Ht 5' 6 (1.676 m)   Wt 147 lb 9.6 oz (67 kg)   SpO2 98%   BMI 23.82 kg/m    Physical Exam Vitals and nursing note reviewed.  Constitutional:      General: She is not in acute distress.    Appearance: Normal appearance.  HENT:     Head: Normocephalic.  Eyes:     Conjunctiva/sclera: Conjunctivae normal.   Cardiovascular:     Rate and Rhythm: Normal rate and regular rhythm.     Pulses: Normal pulses.     Heart sounds: Normal heart sounds.  Pulmonary:     Effort: Pulmonary effort is normal.     Breath sounds: Normal breath sounds.  Musculoskeletal:        General: Tenderness (midline lumbar spine) present. No swelling.     Cervical back: Normal range of motion.     Comments: Hand grip equal bilaterally. Phalen's test negative.   Skin:    General: Skin is warm.  Neurological:     General: No focal deficit present.     Mental Status: She is alert and oriented to person, place, and time.  Psychiatric:        Mood and Affect: Mood normal.        Behavior: Behavior normal.        Thought Content: Thought content normal.        Judgment: Judgment normal.      Assessment & Plan:   Problem List Items Addressed This Visit       Nervous and Auditory   Cervical radiculopathy - Primary   Bilateral hand numbness and pain, more severe on the left, likely due to cervical arthritis-related nerve compression. Hand grip strength equal, no neuro deficits. Start gabapentin  300mg  at bedtime for  management. Refer to orthopedics for further evaluation. Follow-up in 2 months.       Relevant Medications   gabapentin  (NEURONTIN ) 300 MG capsule     Other   Left leg pain   Chronic, ongoing.  Pain is improving, although still there. Gave her the number to orthopedics to call and schedule an appointment.       Acute pain of left shoulder   Left shoulder pain is improving and currently less concerning.      Other Visit Diagnoses       Acute midline low back pain without sciatica       Acute, started in the last few weeks. No red flags on exam. Will have her continue tylenol  as needed and place referral to orthopedics.   Relevant Medications   Acetaminophen  (TYLENOL  EXTRA STRENGTH PO)       Return in about 2 months (around 01/19/2024) for CPE.    Tinnie DELENA Harada, NP

## 2023-11-28 ENCOUNTER — Other Ambulatory Visit (INDEPENDENT_AMBULATORY_CARE_PROVIDER_SITE_OTHER): Payer: Self-pay

## 2023-11-28 ENCOUNTER — Ambulatory Visit (INDEPENDENT_AMBULATORY_CARE_PROVIDER_SITE_OTHER): Admitting: Orthopaedic Surgery

## 2023-11-28 DIAGNOSIS — G5603 Carpal tunnel syndrome, bilateral upper limbs: Secondary | ICD-10-CM

## 2023-11-28 DIAGNOSIS — M79604 Pain in right leg: Secondary | ICD-10-CM

## 2023-11-28 DIAGNOSIS — G5602 Carpal tunnel syndrome, left upper limb: Secondary | ICD-10-CM

## 2023-11-28 DIAGNOSIS — G5601 Carpal tunnel syndrome, right upper limb: Secondary | ICD-10-CM

## 2023-11-28 MED ORDER — METHYLPREDNISOLONE 4 MG PO TBPK: ORAL_TABLET | ORAL | 0 refills | Status: AC

## 2023-11-28 NOTE — Progress Notes (Signed)
 Office Visit Note   Patient: Deborah Wyatt           Date of Birth: [birth year]           MRN: [id number] Visit Date: 11/28/2023              Requested by: Nedra Tinnie LABOR, NP [address] [place],  [place] [address] PCP: Nedra Tinnie LABOR, NP   Assessment & Plan: Visit Diagnoses:  1. Pain in right leg   2. Right carpal tunnel syndrome   3. Left carpal tunnel syndrome     Plan: History of Present Illness Deborah Wyatt is a 53 year old female who presents with numbness in both hands and right leg pain.  Numbness affects both hands, involving the entire hand and occasionally causing her to drop objects. It infrequently wakes her at night. No specific triggers are identified.  Right leg pain is accompanied by a sensation that the leg might give out unexpectedly. The pain is aching and sometimes affects both legs. She describes it as similar to growing pains from her youth. Back pain is also present and may be related to the leg symptoms.  Physical Exam MUSCULOSKELETAL: Carpal tunnel compression test negative bilaterally. Thumb opposition normal bilaterally. Hand muscle strength normal bilaterally.  Assessment and Plan Low back pain with possible lumbar radiculopathy Low back pain with possible lumbar radiculopathy due to arthritis and bulging discs causing nerve compression. - Prescribed Medrol  Dosepak for inflammation. - Initiated physical therapy for the back. - Plan MRI of the lumbar spine if no improvement in six weeks.  Right leg pain likely secondary to lumbar radiculopathy Right leg pain likely secondary to lumbar radiculopathy associated with low back pain. - Addressed with Medrol  Dosepak and physical therapy. - Consider MRI if no improvement in six weeks.  Bilateral hand numbness with possible early carpal tunnel syndrome Bilateral hand numbness suggesting early carpal tunnel syndrome with nerve compression. - Provided carpal tunnel braces to wear at  night for two weeks. - Plan nerve conduction study if symptoms do not improve.  Follow-Up Instructions: No follow-ups on file.   Orders:  Orders Placed This Encounter  Procedures   XR Lumbar Spine 2-3 Views   Ambulatory referral to Physical Therapy   Meds ordered this encounter  Medications   methylPREDNISolone  (MEDROL  DOSEPAK) 4 MG TBPK tablet    Sig: Take as directed    Dispense:  21 tablet    Refill:  0      Procedures: No procedures performed   Clinical Data: No additional findings.   Subjective: Chief Complaint  Patient presents with   Right Leg - Pain   Left Hand - Pain    HPI  Review of Systems  Constitutional: Negative.   HENT: Negative.    Eyes: Negative.   Respiratory: Negative.    Cardiovascular: Negative.   Endocrine: Negative.   Musculoskeletal: Negative.   Neurological: Negative.   Hematological: Negative.   Psychiatric/Behavioral: Negative.    All other systems reviewed and are negative.    Objective: Vital Signs: There were no vitals taken for this visit.  Physical Exam Vitals and nursing note reviewed.  Constitutional:      Appearance: She is well-developed.  HENT:     Head: Atraumatic.     Nose: Nose normal.  Eyes:     Extraocular Movements: Extraocular movements intact.  Cardiovascular:     Pulses: Normal pulses.  Pulmonary:     Effort: Pulmonary effort is  normal.  Abdominal:     Palpations: Abdomen is soft.  Musculoskeletal:     Cervical back: Neck supple.  Skin:    General: Skin is warm.     Capillary Refill: Capillary refill takes less than 2 seconds.  Neurological:     Mental Status: She is alert. Mental status is at baseline.  Psychiatric:        Behavior: Behavior normal.        Thought Content: Thought content normal.        Judgment: Judgment normal.     Ortho Exam  Specialty Comments:  No specialty comments available.  Imaging: XR Lumbar Spine 2-3 Views Result Date: 11/28/2023 X-rays of the lumbar  spine show diffuse degenerative changes.    PMFS History: Patient Active Problem List   Diagnosis Date Noted   Cervical radiculopathy 11/19/2023   Acute pain of left shoulder 10/04/2022   Left leg pain 03/08/2022   Chest pain 03/08/2022   Elevated blood pressure reading 03/08/2022   Tobacco use 03/08/2022   Past Medical History:  Diagnosis Date   Heart murmur    as a baby    Family History  Family history unknown: Yes    No past surgical history on file. Social History   Occupational History   Not on file  Tobacco Use   Smoking status: Former    Current packs/day: 0.25    Types: Cigarettes   Smokeless tobacco: Never   Tobacco comments:    1 or 2 cigarettes every few days        Quit in June 2025  Vaping Use   Vaping status: Never Used  Substance and Sexual Activity   Alcohol use: Yes    Comment: Ocassional   Drug use: No   Sexual activity: Yes    Birth control/protection: None

## 2023-12-22 ENCOUNTER — Ambulatory Visit: Attending: Orthopaedic Surgery

## 2023-12-22 ENCOUNTER — Other Ambulatory Visit: Payer: Self-pay

## 2023-12-22 DIAGNOSIS — M79604 Pain in right leg: Secondary | ICD-10-CM | POA: Diagnosis not present

## 2023-12-22 DIAGNOSIS — G5601 Carpal tunnel syndrome, right upper limb: Secondary | ICD-10-CM | POA: Insufficient documentation

## 2023-12-22 DIAGNOSIS — R262 Difficulty in walking, not elsewhere classified: Secondary | ICD-10-CM | POA: Insufficient documentation

## 2023-12-22 DIAGNOSIS — M5417 Radiculopathy, lumbosacral region: Secondary | ICD-10-CM | POA: Insufficient documentation

## 2023-12-22 DIAGNOSIS — G5602 Carpal tunnel syndrome, left upper limb: Secondary | ICD-10-CM | POA: Diagnosis not present

## 2023-12-22 NOTE — Therapy (Signed)
 OUTPATIENT PHYSICAL THERAPY THORACOLUMBAR EVALUATION   Patient Name: Deborah Wyatt MRN: [id number] DOB:May 27, 1970, 53 y.o., female Today's Date: 12/22/2023  END OF SESSION:  PT End of Session - 12/22/23 1543     Visit Number 1    Date for Recertification  02/16/24    Authorization Type medicaid 12/22/23 to 02/16/24    PT Start Time 1310    PT Stop Time 1346    PT Time Calculation (min) 36 min    Activity Tolerance Patient tolerated treatment well    Behavior During Therapy [facility] for tasks assessed/performed          Past Medical History:  Diagnosis Date   Heart murmur    as a baby   History reviewed. No pertinent surgical history. Patient Active Problem List   Diagnosis Date Noted   Cervical radiculopathy 11/19/2023   Acute pain of left shoulder 10/04/2022   Left leg pain 03/08/2022   Chest pain 03/08/2022   Elevated blood pressure reading 03/08/2022   Tobacco use 03/08/2022    PCP: Nedra Tinnie LABOR, NP  REFERRING PROVIDER: Jerri Moody, MD  REFERRING DIAG: R leg pain  Rationale for Evaluation and Treatment: Rehabilitation  THERAPY DIAG:  Radiculopathy, lumbosacral region  Difficulty in walking, not elsewhere classified  ONSET DATE: one year, no known incident  SUBJECTIVE:                                                                                                                                                                                           SUBJECTIVE STATEMENT: Reports chronic R LE pain and weakness greater than one year. No precipitating event, progressing, worse with walking than at any other time   PERTINENT HISTORY:  Referred by orthopedist to PT due to R LE pain  PAIN:  Are you having pain? Yes: NPRS scale: 0 to 5 Pain location: B ant thighs, primarily R,  Pain description: deep pain, weakness Aggravating factors: walking Relieving factors: sitting, rest  PRECAUTIONS: None  RED FLAGS: None   WEIGHT BEARING RESTRICTIONS:  No  FALLS:  Has patient fallen in last 6 months? 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  DIAGNOSTIC FINDINGS:  X rays show multi level degenerative changes lumbar spine  PATIENT SURVEYS:  Modified Oswestry Low Back Pain  Disability Questionnaire: 21 / 50 = 42.0 %  COGNITION: Overall cognitive status: Within functional limits for tasks assessed     SENSATION: Reports occasional stocking like numbness R thigh , no numbness R foot or lower leg  MUSCLE LENGTH: Hamstrings: Right -35p! deg; Left wnl deg Debby test: Right -10 pain R lumbar deg; Left wnl but pain medial upper thigh deg  POSTURE: increased lumbar lordosis and no lateral shift noted not fully weight bearing on R LE , fasciculations noted R LE at times in standing  PALPATION: PA glides with marked pain upper lumbar L 2 region, with radiation into R ant thigh, lower leg   LUMBAR ROM:   AROM eval  Flexion wfl  Extension 50 % with back pain  Right lateral flexion   Left lateral flexion   Right rotation   Left rotation    (Blank rows = not tested)  LOWER EXTREMITY ROM:   all wnl  LOWER EXTREMITY MMT:    MMT Right eval Left eval  Hip flexion 4 5  Hip extension    Hip abduction 3 wnl  Hip adduction    Hip internal rotation    Hip external rotation 4 wnl  Knee flexion    Knee extension    Ankle dorsiflexion heel walk 5 5  Ankle plantarflexion toe walk Unable on R 3-/5 with MMT in supine with ankle plantarflexion resistedn wfl  Ankle inversion    Ankle eversion     (Blank rows = not tested)  LUMBAR SPECIAL TESTS:  Straight  leg raise test: Positive oN R  FUNCTIONAL TESTS:  30 seconds chair stand test 4  GAIT: Distance walked: in clinic 62' Assistive device utilized: none Level of assistance: Complete Independence Comments: antalgic on R   TREATMENT DATE: 12/22/23: evaluation, provided with written directions for stretches to open R lumbar spine                                                                                                                                 PATIENT EDUCATION:  Education details: POC, goals Person educated: Patient Education method: Programmer, multimedia, Demonstration, Verbal cues, and Handouts Education comprehension: verbalized understanding, returned demonstration, and verbal cues required  HOME EXERCISE PROGRAM: Access Code: [email or web address] URL: [email or web address] Date: 12/22/2023 Prepared by: Yunus Stoklosa  Exercises - Sidelying Lumbar Rotation Stretch  - 1 x daily - 7 x weekly - 3 sets - 10 reps - Seated Flexion Stretch  - 1 x daily - 7 x weekly - 3 sets - 10 reps  ASSESSMENT:  CLINICAL IMPRESSION: Patient is a 53 y.o. female who was evaluated today by physical therapy  for R leg pain.  Presents with weakness R plantarflexors, R hip abductors, ER and flexors.  Also pain with palpation upper lumbar region, + SLR on R. Has antalgic gait and occasional buckling on R.  Should benefit from physical therapy to address radicular Sx and weakness R LE  OBJECTIVE IMPAIRMENTS: decreased activity tolerance, decreased balance, decreased endurance, decreased knowledge of condition, difficulty walking, decreased strength, and pain.   ACTIVITY LIMITATIONS: carrying, lifting, bending, and standing  PARTICIPATION LIMITATIONS: meal prep, cleaning, laundry, shopping, and community activity  PERSONAL FACTORS: Age, Behavior pattern, Fitness, Past/current experiences, Time since onset of injury/illness/exacerbation, and 1-2 comorbidities: HTN are also affecting patient's functional  outcome.   REHAB POTENTIAL: Good  CLINICAL DECISION MAKING: Evolving/moderate complexity  EVALUATION COMPLEXITY: Moderate   GOALS: Goals reviewed with patient? Yes  SHORT TERM GOALS: Target date: 2 weeks 01/05/24  I HEP Baseline: Goal status: INITIAL   LONG TERM GOALS: Target date: 8 weeks, 02/16/24  Improve modified oswestry score from 42% to 20% or less Baseline:  Goal status: INITIAL  2.  Improve R plantarflexor, hip abd, hip ER strength to 5/5 Baseline: 3-/5 Goal status: INITIAL  3.  30 sec sit to stand 11 reps or greater  Baseline: 4 Goal status: INITIAL  4.  Gait without antalgia R Baseline: consistent antalgia R LE Goal status: INITIAL  PLAN:  PT FREQUENCY: 1-2x/week  PT DURATION: 8 weeks  PLANNED INTERVENTIONS: 97110-Therapeutic exercises, 97530- Therapeutic activity, [id number]- Neuromuscular re-education, 97535- Self Care, [id number]- Manual therapy, [id number]- Traction (mechanical), and Patient/Family education.  PLAN FOR NEXT SESSION: reassess each visit for strength deficits R LE, may try pelvic traction, initiate strengthening for B hips , lower legs.  Modalities as needed   Glendell Schlottman L Kannen Moxey, PT, DPT, OCS 12/22/2023, 3:46 PM

## 2024-01-12 ENCOUNTER — Ambulatory Visit: Attending: Orthopaedic Surgery

## 2024-01-12 DIAGNOSIS — R262 Difficulty in walking, not elsewhere classified: Secondary | ICD-10-CM | POA: Insufficient documentation

## 2024-01-12 DIAGNOSIS — M5417 Radiculopathy, lumbosacral region: Secondary | ICD-10-CM | POA: Insufficient documentation

## 2024-01-19 ENCOUNTER — Ambulatory Visit

## 2024-01-19 ENCOUNTER — Other Ambulatory Visit: Payer: Self-pay

## 2024-01-19 DIAGNOSIS — M5417 Radiculopathy, lumbosacral region: Secondary | ICD-10-CM

## 2024-01-19 DIAGNOSIS — R262 Difficulty in walking, not elsewhere classified: Secondary | ICD-10-CM | POA: Diagnosis present

## 2024-01-19 NOTE — Therapy (Signed)
 OUTPATIENT PHYSICAL THERAPY THORACOLUMBAR EVALUATION   Patient Name: Deborah Wyatt MRN: [id number] DOB:1970/08/07, 53 y.o., female Today's Date: 01/19/2024  END OF SESSION:  PT End of Session - 01/19/24 1303     Visit Number 2    Date for Recertification  02/16/24    Authorization Type medicaid 12/22/23 to 02/16/24    PT Start Time 1255    PT Stop Time 1340    PT Time Calculation (min) 45 min    Activity Tolerance Patient tolerated treatment well    Behavior During Therapy [facility] for tasks assessed/performed           Past Medical History:  Diagnosis Date   Heart murmur    as a baby   History reviewed. No pertinent surgical history. Patient Active Problem List   Diagnosis Date Noted   Cervical radiculopathy 11/19/2023   Acute pain of left shoulder 10/04/2022   Left leg pain 03/08/2022   Chest pain 03/08/2022   Elevated blood pressure reading 03/08/2022   Tobacco use 03/08/2022    PCP: Nedra Tinnie LABOR, NP  REFERRING PROVIDER: Jerri Moody, MD  REFERRING DIAG: R leg pain  Rationale for Evaluation and Treatment: Rehabilitation  THERAPY DIAG:  Radiculopathy, lumbosacral region  Difficulty in walking, not elsewhere classified  ONSET DATE: one year, no known incident  SUBJECTIVE:                                                                                                                                                                                           SUBJECTIVE STATEMENT:01/19/24: worked a lot last week, in  a lot of pain with walking lower back and both legs  Eval:Reports chronic R LE pain and weakness greater than one year. No precipitating event, progressing, worse with walking than at any other time   PERTINENT HISTORY:  Referred by orthopedist to PT due to R LE pain  PAIN:  Are you having pain? Yes: NPRS scale: 0 to 5 Pain location: B ant thighs, primarily R,  Pain description: deep pain, weakness Aggravating factors: walking Relieving  factors: sitting, rest  PRECAUTIONS: None  RED FLAGS: None   WEIGHT BEARING RESTRICTIONS: No  FALLS:  Has patient fallen in last 6 months? 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  DIAGNOSTIC  FINDINGS:  X rays show multi level degenerative changes lumbar spine  PATIENT SURVEYS:  Modified Oswestry Low Back Pain Disability Questionnaire: 21 / 50 = 42.0 %  COGNITION: Overall cognitive status: Within functional limits for tasks assessed     SENSATION: Reports occasional stocking like numbness R thigh , no numbness R foot or lower leg  MUSCLE LENGTH: Hamstrings: Right -35p! deg; Left wnl deg Debby test: Right -10 pain R lumbar deg; Left wnl but pain medial upper thigh deg  POSTURE: increased lumbar lordosis and no lateral shift noted not fully weight bearing on R LE , fasciculations noted R LE at times in standing  PALPATION: PA glides with marked pain upper lumbar L 2 region, with radiation into R ant thigh, lower leg   LUMBAR ROM:   AROM eval  Flexion wfl  Extension 50 % with back pain  Right lateral flexion   Left lateral flexion   Right rotation   Left rotation    (Blank rows = not tested)  LOWER EXTREMITY ROM:   all wnl  LOWER EXTREMITY MMT:    MMT Right eval Left eval  Hip flexion 4 5  Hip extension    Hip abduction 3 wnl  Hip adduction    Hip internal rotation    Hip external rotation 4 wnl  Knee flexion    Knee extension    Ankle dorsiflexion heel walk 5 5  Ankle plantarflexion toe walk Unable on R 3-/5 with MMT in supine with ankle plantarflexion resistedn wfl  Ankle  inversion    Ankle eversion     (Blank rows = not tested)  LUMBAR SPECIAL TESTS:  Straight leg raise test: Positive oN R  FUNCTIONAL TESTS:  30 seconds chair stand test 4  GAIT: Distance walked: in clinic 53' Assistive device utilized: none Level of assistance: Complete Independence Comments: antalgic on R   TREATMENT DATE:  01/19/24:  Nustep L 5 x 5 min 30 sec Supine legs extended and in hook lying for manual pelvic traction with sheet.  Improved Sx in hook lying with the manual traction Physioball, 65 cm under thighs, for attempted B knee to chest but increased LE Sx Supine LTR with legs over ball and in hook lying. Some relief with this ex  Side lying on each side for theragun deep tissue massage B glut medius and B lumbar paraspinals Seated for static R hamstring stretch/ neural glide  Seated for blue t band ankle pumps    12/22/23: evaluation, provided with written directions for stretches to open R lumbar spine                                                                                                                                 PATIENT EDUCATION:  Education details: POC, goals Person educated: Patient Education method: Explanation, Demonstration, Verbal cues, and Handouts Education comprehension: verbalized understanding, returned demonstration, and verbal cues required  HOME EXERCISE PROGRAM: Access  Code: [email or web address] URL: [email or web address] Date: 01/19/2024 Prepared by: Savannaha Stonerock  Exercises - Sidelying Lumbar Rotation Stretch  - 1 x daily - 7 x weekly - 3 sets - 10 reps - Seated Flexion Stretch  - 1 x daily - 7 x weekly - 3 sets - 10 reps - Lower Trunk Rotations  - 1 x daily - 7 x weekly - 3 sets - 10 reps - Seated Hamstring Stretch with Strap  - 1 x daily - 7 x weekly - 3 sets - 10 reps Access Code: [email or web address] URL: [email or web address] Date: 12/22/2023 Prepared by: Charna Neeb  Exercises - Sidelying Lumbar Rotation  Stretch  - 1 x daily - 7 x weekly - 3 sets - 10 reps - Seated Flexion Stretch  - 1 x daily - 7 x weekly - 3 sets - 10 reps  ASSESSMENT:  CLINICAL IMPRESSION: Patient is a 53 y.o. female who attended her second PT session today with  physical therapy  for R leg pain. Reassessed B plantarflexor strength, still very weak, 2+5 with pain radicular pattern R LE and lower back with manual resistance.  Labored gait.  She was encouraged to contact the orthopedist as she is past the 6 week mark that he recommended to contact him for MRI.  She tolerated today's ex well except occasional triggering of radicular Sx with slump test positioning.  She is to return Nov 11 .   Should benefit from physical therapy to address radicular Sx and weakness R LE  OBJECTIVE IMPAIRMENTS: decreased activity tolerance, decreased balance, decreased endurance, decreased knowledge of condition, difficulty walking, decreased strength, and pain.   ACTIVITY LIMITATIONS: carrying, lifting, bending, and standing  PARTICIPATION LIMITATIONS: meal prep, cleaning, laundry, shopping, and community activity  PERSONAL FACTORS: Age, Behavior pattern, Fitness, Past/current experiences, Time since onset of injury/illness/exacerbation, and 1-2 comorbidities: HTN are also affecting patient's functional outcome.   REHAB POTENTIAL: Good  CLINICAL DECISION MAKING: Evolving/moderate complexity  EVALUATION COMPLEXITY: Moderate   GOALS: Goals reviewed with patient? Yes  SHORT TERM GOALS: Target date: 2 weeks 01/05/24  I HEP Baseline: Goal status: INITIAL   LONG TERM GOALS: Target date: 8 weeks, 02/16/24  Improve modified oswestry score from 42% to 20% or less Baseline:  Goal status: INITIAL  2.  Improve R plantarflexor, hip abd, hip ER strength to 5/5 Baseline: 3-/5 Goal status: INITIAL  3.  30 sec sit to stand 11 reps or greater  Baseline: 4 Goal status: INITIAL  4.  Gait without antalgia R Baseline: consistent antalgia R  LE Goal status: INITIAL  PLAN:  PT FREQUENCY: 1-2x/week  PT DURATION: 8 weeks  PLANNED INTERVENTIONS: 97110-Therapeutic exercises, 97530- Therapeutic activity, [id number]- Neuromuscular re-education, 97535- Self Care, [id number]- Manual therapy, [id number]- Traction (mechanical), and Patient/Family education.  PLAN FOR NEXT SESSION: reassess each visit for strength deficits R LE, may try pelvic traction, initiate strengthening for B hips , lower legs.  Modalities as needed   Shamell Hittle L Miyana Mordecai, PT, DPT, OCS 01/19/2024, 3:54 PM

## 2024-01-26 ENCOUNTER — Ambulatory Visit: Admitting: Nurse Practitioner

## 2024-01-26 ENCOUNTER — Ambulatory Visit

## 2024-02-04 ENCOUNTER — Encounter: Payer: Self-pay | Admitting: Nurse Practitioner

## 2024-02-09 ENCOUNTER — Ambulatory Visit

## 2024-02-16 ENCOUNTER — Ambulatory Visit: Attending: Orthopaedic Surgery

## 2024-02-23 ENCOUNTER — Ambulatory Visit: Admitting: Nurse Practitioner

## 2024-03-01 ENCOUNTER — Ambulatory Visit: Attending: Orthopaedic Surgery | Admitting: Physical Therapy
